# Patient Record
Sex: Female | Born: 1935 | Race: White | Hispanic: No | Marital: Married | Smoking: Former smoker
Health system: Southern US, Community
[De-identification: ages and names within clinical notes are randomized; demographics above are authoritative.]

## PROBLEM LIST (undated history)

## (undated) DIAGNOSIS — M2040 Other hammer toe(s) (acquired), unspecified foot: Secondary | ICD-10-CM

## (undated) DIAGNOSIS — E669 Obesity, unspecified: Secondary | ICD-10-CM

## (undated) DIAGNOSIS — J45909 Unspecified asthma, uncomplicated: Secondary | ICD-10-CM

## (undated) DIAGNOSIS — N3090 Cystitis, unspecified without hematuria: Secondary | ICD-10-CM

## (undated) DIAGNOSIS — J449 Chronic obstructive pulmonary disease, unspecified: Secondary | ICD-10-CM

## (undated) DIAGNOSIS — D1800 Hemangioma unspecified site: Secondary | ICD-10-CM

## (undated) DIAGNOSIS — C801 Malignant (primary) neoplasm, unspecified: Secondary | ICD-10-CM

## (undated) DIAGNOSIS — R768 Other specified abnormal immunological findings in serum: Secondary | ICD-10-CM

## (undated) DIAGNOSIS — E039 Hypothyroidism, unspecified: Secondary | ICD-10-CM

## (undated) DIAGNOSIS — M19011 Primary osteoarthritis, right shoulder: Secondary | ICD-10-CM

## (undated) DIAGNOSIS — H5203 Hypermetropia, bilateral: Secondary | ICD-10-CM

## (undated) DIAGNOSIS — M797 Fibromyalgia: Secondary | ICD-10-CM

## (undated) DIAGNOSIS — L820 Inflamed seborrheic keratosis: Secondary | ICD-10-CM

## (undated) DIAGNOSIS — K589 Irritable bowel syndrome without diarrhea: Secondary | ICD-10-CM

## (undated) DIAGNOSIS — I350 Nonrheumatic aortic (valve) stenosis: Secondary | ICD-10-CM

## (undated) DIAGNOSIS — M810 Age-related osteoporosis without current pathological fracture: Secondary | ICD-10-CM

## (undated) DIAGNOSIS — Z87891 Personal history of nicotine dependence: Secondary | ICD-10-CM

## (undated) DIAGNOSIS — E119 Type 2 diabetes mellitus without complications: Secondary | ICD-10-CM

## (undated) DIAGNOSIS — H5050 Unspecified heterophoria: Secondary | ICD-10-CM

## (undated) DIAGNOSIS — M542 Cervicalgia: Secondary | ICD-10-CM

## (undated) DIAGNOSIS — M5414 Radiculopathy, thoracic region: Secondary | ICD-10-CM

## (undated) DIAGNOSIS — R296 Repeated falls: Secondary | ICD-10-CM

## (undated) DIAGNOSIS — Z22322 Carrier or suspected carrier of Methicillin resistant Staphylococcus aureus: Secondary | ICD-10-CM

## (undated) DIAGNOSIS — M160 Bilateral primary osteoarthritis of hip: Secondary | ICD-10-CM

## (undated) DIAGNOSIS — R32 Unspecified urinary incontinence: Secondary | ICD-10-CM

## (undated) DIAGNOSIS — K449 Diaphragmatic hernia without obstruction or gangrene: Secondary | ICD-10-CM

## (undated) DIAGNOSIS — K219 Gastro-esophageal reflux disease without esophagitis: Secondary | ICD-10-CM

## (undated) DIAGNOSIS — I10 Essential (primary) hypertension: Secondary | ICD-10-CM

## (undated) DIAGNOSIS — H8110 Benign paroxysmal vertigo, unspecified ear: Secondary | ICD-10-CM

## (undated) DIAGNOSIS — M48062 Spinal stenosis, lumbar region with neurogenic claudication: Secondary | ICD-10-CM

## (undated) DIAGNOSIS — K5790 Diverticulosis of intestine, part unspecified, without perforation or abscess without bleeding: Secondary | ICD-10-CM

## (undated) DIAGNOSIS — I340 Nonrheumatic mitral (valve) insufficiency: Secondary | ICD-10-CM

## (undated) DIAGNOSIS — F32A Depression, unspecified: Secondary | ICD-10-CM

## (undated) DIAGNOSIS — M5417 Radiculopathy, lumbosacral region: Secondary | ICD-10-CM

## (undated) DIAGNOSIS — H9190 Unspecified hearing loss, unspecified ear: Secondary | ICD-10-CM

## (undated) DIAGNOSIS — E559 Vitamin D deficiency, unspecified: Secondary | ICD-10-CM

## (undated) DIAGNOSIS — D0362 Melanoma in situ of left upper limb, including shoulder: Secondary | ICD-10-CM

## (undated) DIAGNOSIS — H524 Presbyopia: Secondary | ICD-10-CM

## (undated) DIAGNOSIS — H52203 Unspecified astigmatism, bilateral: Secondary | ICD-10-CM

## (undated) DIAGNOSIS — G629 Polyneuropathy, unspecified: Secondary | ICD-10-CM

## (undated) DIAGNOSIS — H2511 Age-related nuclear cataract, right eye: Secondary | ICD-10-CM

## (undated) DIAGNOSIS — Z9841 Cataract extraction status, right eye: Secondary | ICD-10-CM

## (undated) DIAGNOSIS — H43813 Vitreous degeneration, bilateral: Secondary | ICD-10-CM

## (undated) DIAGNOSIS — Q6689 Other  specified congenital deformities of feet: Secondary | ICD-10-CM

## (undated) HISTORY — DX: Nonrheumatic mitral (valve) insufficiency: I34.0

## (undated) HISTORY — DX: Age-related osteoporosis without current pathological fracture: M81.0

## (undated) HISTORY — DX: Unspecified urinary incontinence: R32

## (undated) HISTORY — DX: Hypermetropia, bilateral: H52.03

## (undated) HISTORY — DX: Hypermetropia, bilateral: H52.4

## (undated) HISTORY — DX: Other hammer toe(s) (acquired), unspecified foot: M20.40

## (undated) HISTORY — DX: Other specified abnormal immunological findings in serum: R76.8

## (undated) HISTORY — DX: Depression, unspecified: F32.A

## (undated) HISTORY — DX: Melanoma in situ of left upper limb, including shoulder: D03.62

## (undated) HISTORY — DX: Other specified congenital deformities of feet: Q66.89

## (undated) HISTORY — DX: Irritable bowel syndrome without diarrhea: K58.9

## (undated) HISTORY — DX: Cystitis, unspecified without hematuria: N30.90

## (undated) HISTORY — DX: Diverticulosis of intestine, part unspecified, without perforation or abscess without bleeding: K57.90

## (undated) HISTORY — DX: Age-related nuclear cataract, right eye: H25.11

## (undated) HISTORY — DX: Gastro-esophageal reflux disease without esophagitis: K21.9

## (undated) HISTORY — DX: Malignant (primary) neoplasm, unspecified: C80.1

## (undated) HISTORY — DX: Type 2 diabetes mellitus without complications: E11.9

## (undated) HISTORY — DX: Fibromyalgia: M79.7

## (undated) HISTORY — DX: Radiculopathy, thoracic region: M54.14

## (undated) HISTORY — DX: Carrier or suspected carrier of methicillin resistant Staphylococcus aureus: Z22.322

## (undated) HISTORY — DX: Hemangioma unspecified site: D18.00

## (undated) HISTORY — DX: Primary osteoarthritis, right shoulder: M19.011

## (undated) HISTORY — DX: Repeated falls: R29.6

## (undated) HISTORY — DX: Unspecified astigmatism, bilateral: H52.203

## (undated) HISTORY — DX: Cervicalgia: M54.2

## (undated) HISTORY — DX: Hypothyroidism, unspecified: E03.9

## (undated) HISTORY — DX: Unspecified hearing loss, unspecified ear: H91.90

## (undated) HISTORY — DX: Chronic obstructive pulmonary disease, unspecified: J44.9

## (undated) HISTORY — DX: Unspecified heterophoria: H50.50

## (undated) HISTORY — DX: Nonrheumatic aortic (valve) stenosis: I35.0

## (undated) HISTORY — DX: Inflamed seborrheic keratosis: L82.0

## (undated) HISTORY — DX: Spinal stenosis, lumbar region with neurogenic claudication: M48.062

## (undated) HISTORY — DX: Benign paroxysmal vertigo, unspecified ear: H81.10

## (undated) HISTORY — DX: Obesity, unspecified: E66.9

## (undated) HISTORY — DX: Bilateral primary osteoarthritis of hip: M16.0

## (undated) HISTORY — DX: Polyneuropathy, unspecified: G62.9

## (undated) HISTORY — DX: Unspecified asthma, uncomplicated: J45.909

## (undated) HISTORY — DX: Personal history of nicotine dependence: Z87.891

## (undated) HISTORY — DX: Diaphragmatic hernia without obstruction or gangrene: K44.9

## (undated) HISTORY — DX: Essential (primary) hypertension: I10

## (undated) HISTORY — DX: Cataract extraction status, right eye: Z98.41

## (undated) HISTORY — DX: Vitreous degeneration, bilateral: H43.813

## (undated) HISTORY — DX: Radiculopathy, lumbosacral region: M54.17

## (undated) HISTORY — DX: Vitamin D deficiency, unspecified: E55.9

---

## 1970-08-01 HISTORY — PX: APPENDECTOMY: SHX54

## 1971-08-02 HISTORY — PX: BLADDER SUSPENSION: SHX72

## 1981-08-01 HISTORY — PX: OTHER SURGICAL HISTORY: SHX169

## 1987-08-02 HISTORY — PX: ESOPHAGUS SURGERY: SHX626

## 1987-08-02 HISTORY — PX: OTHER SURGICAL HISTORY: SHX169

## 1995-08-02 HISTORY — PX: BREAST LUMPECTOMY: SHX2

## 1999-08-02 HISTORY — PX: TOTAL KNEE ARTHROPLASTY: SHX125

## 2001-08-01 HISTORY — PX: TOTAL KNEE ARTHROPLASTY: SHX125

## 2003-07-02 DIAGNOSIS — K219 Gastro-esophageal reflux disease without esophagitis: Secondary | ICD-10-CM | POA: Insufficient documentation

## 2003-07-02 DIAGNOSIS — K589 Irritable bowel syndrome without diarrhea: Secondary | ICD-10-CM | POA: Insufficient documentation

## 2008-11-11 DIAGNOSIS — H8111 Benign paroxysmal vertigo, right ear: Secondary | ICD-10-CM | POA: Insufficient documentation

## 2010-08-01 HISTORY — PX: CATARACT EXTRACTION: SUR2

## 2016-06-17 DIAGNOSIS — M797 Fibromyalgia: Secondary | ICD-10-CM | POA: Insufficient documentation

## 2016-12-19 DIAGNOSIS — E669 Obesity, unspecified: Secondary | ICD-10-CM | POA: Insufficient documentation

## 2017-02-24 DIAGNOSIS — M48062 Spinal stenosis, lumbar region with neurogenic claudication: Secondary | ICD-10-CM | POA: Insufficient documentation

## 2017-08-01 HISTORY — PX: TOTAL HIP ARTHROPLASTY: SHX124

## 2020-02-10 DIAGNOSIS — K922 Gastrointestinal hemorrhage, unspecified: Secondary | ICD-10-CM | POA: Insufficient documentation

## 2020-04-13 ENCOUNTER — Ambulatory Visit (INDEPENDENT_AMBULATORY_CARE_PROVIDER_SITE_OTHER): Payer: Medicare Other | Admitting: Cardiovascular Disease

## 2020-04-13 ENCOUNTER — Encounter: Payer: Self-pay | Admitting: Cardiovascular Disease

## 2020-04-13 ENCOUNTER — Other Ambulatory Visit: Payer: Self-pay

## 2020-04-13 VITALS — BP 116/66 | HR 93 | Ht 62.0 in | Wt 202.0 lb

## 2020-04-13 DIAGNOSIS — I340 Nonrheumatic mitral (valve) insufficiency: Secondary | ICD-10-CM | POA: Insufficient documentation

## 2020-04-13 DIAGNOSIS — I10 Essential (primary) hypertension: Secondary | ICD-10-CM | POA: Diagnosis not present

## 2020-04-13 NOTE — Patient Instructions (Signed)
Medication Instructions:  Your physician has recommended you make the following change in your medication:   STOP: lisinopril  *If you need a refill on your cardiac medications before your next appointment, please call your pharmacy*   Lab Work: None  If you have labs (blood work) drawn today and your tests are completely normal, you will receive your results only by: Marland Kitchen MyChart Message (if you have MyChart) OR . A paper copy in the mail If you have any lab test that is abnormal or we need to change your treatment, we will call you to review the results.   Testing/Procedures: Your physician has requested that you have an echocardiogram. Echocardiography is a painless test that uses sound waves to create images of your heart. It provides your doctor with information about the size and shape of your heart and how well your heart's chambers and valves are working. This procedure takes approximately one hour. There are no restrictions for this procedure.  Follow-Up: At [facility], you and your health needs are our priority.  As part of our continuing mission to provide you with exceptional heart care, we have created designated Provider Care Teams.  These Care Teams include your primary Cardiologist (physician) and Advanced Practice Providers (APPs -  Physician Assistants and Nurse Practitioners) who all work together to provide you with the care you need, when you need it.  We recommend signing up for the patient portal called "MyChart".  Sign up information is provided on this After Visit Summary.  MyChart is used to connect with patients for Virtual Visits (Telemedicine).  Patients are able to view lab/test results, encounter notes, upcoming appointments, etc.  Non-urgent messages can be sent to your provider as well.   To learn more about what you can do with MyChart, go to [email or web address].    Your next appointment:   12 month(s)  The format for your next appointment:   In  Person  Provider:   You may see Mertie Moores, MD or one of the following Advanced Practice Providers on your designated Care Team:    Richardson Dopp, PA-C  Robbie Lis, [place]    Other Instructions None

## 2020-04-13 NOTE — Progress Notes (Signed)
Cardiology Office Note:    Date:  04/13/2020   ID:  Darlene Lloyd, DOB 10/17/1932, MRN [id number]  PCP:  Lajean Manes, MD  [facility] Cardiologist:  [facility] Electrophysiologist:  None   Referring MD: Lajean Manes, MD   Chief Complaint  Patient presents with  . Hypertension    Sept, 13, 2021   Darlene Lloyd is a 86 y.o. female with a hx of aortic stenosis We are asked to see her for further eval of her AS by Dr. Felipa Eth  Recently moved from [place].  Had some spine surgery . A pre op eval revealed a murmur ,  Echo apparently showed mitral valve regurgitation   No CP , Does have shortness of breath  Dyspnea is slow in developing  No palpitations  Uses a walker since having her hip replaced Has benign positional vertigo and uses a walker  Walking with the walker makes her short of breath      Past Medical History:  Diagnosis Date  . ANA positive   . Aortic valve stenosis   . Asthma   . BPPV (benign paroxysmal positional vertigo)   . Cancer [facility])    breast  . Cataract extraction status of eye, right   . Cervicalgia   . Claw toe   . COPD (chronic obstructive pulmonary disease) ([place])   . Cystitis   . Depression   . Diverticulosis   . DM (diabetes mellitus) ([place])   . Fibromyalgia   . Former smoker   . Frequent falls   . GERD (gastroesophageal reflux disease)   . Hammer toe   . Hearing loss   . Hemangioma   . Hernia, diaphragmatic   . Hyperopia of both eyes with astigmatism and presbyopia   . Hypertension   . Hypothyroidism   . IBS (irritable bowel syndrome)   . Keratosis, inflamed seborrheic   . Melanoma in situ of left upper arm ([place])   . Mitral valve regurgitation   . MRSA carrier   . Neuropathy   . Nuclear sclerosis of right eye   . Obesity   . Osteoarthritis of both hips   . Osteoarthritis of right shoulder   . Osteoporosis   . Phoria   . PVD (posterior vitreous detachment), both eyes   . Senile osteoporosis   . Spinal  stenosis of lumbar region with neurogenic claudication   . Thoracic and lumbosacral neuritis   . Urine incontinence   . Vitamin D deficiency     Past Surgical History:  Procedure Laterality Date  . APPENDECTOMY  1972  . BLADDER SUSPENSION  1973  . BREAST LUMPECTOMY  1997  . CATARACT EXTRACTION  2012  . ESOPHAGUS SURGERY  1989  . hiatal  1989  . laminectomy  1983  . TOTAL HIP ARTHROPLASTY  2019  . TOTAL KNEE ARTHROPLASTY  2001  . TOTAL KNEE ARTHROPLASTY  2003    Current Medications: Current Meds  Medication Sig  . albuterol (VENTOLIN HFA) 108 (90 Base) MCG/ACT inhaler Inhale into the lungs every 6 (six) hours as needed for wheezing or shortness of breath.  . ALPRAZolam (XANAX) 0.25 MG tablet Take 0.25 mg by mouth at bedtime as needed for anxiety.  . AMLODIPINE BESYLATE PO Take 10 mg by mouth.  Marland Kitchen ascorbic acid (VITAMIN C) 500 MG tablet Take 500 mg by mouth daily.  . bisacodyl (BISACODYL) 5 MG EC tablet Take 10 mg by mouth daily as needed for moderate constipation.  . Cholecalciferol (VITAMIN D3)  25 MCG (1000 UT) CAPS Take by mouth.  . dicyclomine (BENTYL) 20 MG tablet Take 20 mg by mouth every 6 (six) hours.  Marland Kitchen esomeprazole (NEXIUM) 20 MG capsule Take 20 mg by mouth daily at 12 noon.  . fluticasone (FLONASE) 50 MCG/ACT nasal spray Place into both nostrils daily.  . Fluticasone-Salmeterol (ADVAIR) 100-50 MCG/DOSE AEPB Inhale 1 puff into the lungs 2 (two) times daily.  . folic acid (FOLVITE) 761 MCG tablet Take 400 mcg by mouth daily.  Marland Kitchen HYOSCYAMINE SULFATE PO Take 0.125 mg by mouth.  . lamoTRIgine (LAMICTAL) 25 MG tablet Take 25 mg by mouth daily.  Marland Kitchen levothyroxine (SYNTHROID) 150 MCG tablet Take 150 mcg by mouth daily before breakfast.  . Loperamide HCl (IMODIUM A-D PO) Take by mouth.  . losartan (COZAAR) 25 MG tablet Take 25 mg by mouth daily.  . Magnesium 500 MG CAPS Take by mouth.  . MULTIPLE VITAMIN PO Take by mouth.  . naproxen (NAPROSYN) 500 MG tablet Take 500 mg by mouth  2 (two) times daily with a meal.  . Niacin (VITAMIN B-3 PO) Take 25 mcg by mouth.  . potassium chloride (KLOR-CON) 10 MEQ tablet Take 10 mEq by mouth once.  . pyridOXINE (VITAMIN B-6) 100 MG tablet Take 100 mg by mouth daily.  . sertraline (ZOLOFT) 100 MG tablet Take 100 mg by mouth daily.  . [DISCONTINUED] lisinopril (ZESTRIL) 5 MG tablet Take 5 mg by mouth daily.     Allergies:   Codeine and Sulfasalazine   Social History   Socioeconomic History  . Marital status: Married    Spouse name: Not on file  . Number of children: Not on file  . Years of education: Not on file  . Highest education level: Not on file  Occupational History  . Not on file  Tobacco Use  . Smoking status: Former Research scientist (life sciences)  . Smokeless tobacco: Never Used  Substance and Sexual Activity  . Alcohol use: Yes  . Drug use: Never  . Sexual activity: Not on file  Other Topics Concern  . Not on file  Social History Narrative  . Not on file   Social Determinants of Health   Financial Resource Strain:   . Difficulty of Paying Living Expenses: Not on file  Food Insecurity:   . Worried About Charity fundraiser in the Last Year: Not on file  . Ran Out of Food in the Last Year: Not on file  Transportation Needs:   . Lack of Transportation (Medical): Not on file  . Lack of Transportation (Non-Medical): Not on file  Physical Activity:   . Days of Exercise per Week: Not on file  . Minutes of Exercise per Session: Not on file  Stress:   . Feeling of Stress : Not on file  Social Connections:   . Frequency of Communication with Friends and Family: Not on file  . Frequency of Social Gatherings with Friends and Family: Not on file  . Attends Religious Services: Not on file  . Active Member of Clubs or Organizations: Not on file  . Attends Archivist Meetings: Not on file  . Marital Status: Not on file     Family History: The patient's family history includes Hypertension in her father.  ROS:   Please  see the history of present illness.     All other systems reviewed and are negative.  EKGs/Labs/Other Studies Reviewed:    The following studies were reviewed today:   EKG:  EKG is  ordered today.  The ekg ordered today demonstrates   Recent Labs: No results found for requested labs within last 8760 hours.  Recent Lipid Panel No results found for: CHOL, TRIG, HDL, CHOLHDL, VLDL, LDLCALC, LDLDIRECT  Physical Exam:    VS:  BP 116/66   Pulse 93   Ht 5\' 2"  (1.575 m)   Wt 202 lb (91.6 kg)   SpO2 96%   BMI 36.95 kg/m     Wt Readings from Last 3 Encounters:  04/13/20 202 lb (91.6 kg)     GEN:   Elderly female,  NAD  HEENT: Normal NECK: No JVD; No carotid bruits LYMPHATICS: No lymphadenopathy CARDIAC:  RR, 2/6 systolic murmur at LSB,  No radiation to axilla.  No significant ratiation to RSB  RESPIRATORY:  Clear to auscultation without rales, wheezing or rhonchi  ABDOMEN: moderately obese  MUSCULOSKELETAL:  No edema; No deformity  SKIN: Warm and dry NEUROLOGIC:  Alert and oriented x 3 PSYCHIATRIC:  Normal affect   ECG: April 13, 2020: Normal sinus rhythm at 93.?  Previous inferior wall myocardial infarction.  ASSESSMENT:    1. Mitral valve insufficiency, unspecified etiology    PLAN:    In order of problems listed above:  1. Mitral regurgitation: Patient has a systolic murmur and was told at her previous doctors office that she had mitral regurgitation.  Like to get a repeat echocardiogram to further evaluate her LV function and mitral valve function.  Its been over a year since her last echo.  At present she does not seem to be limited by any cardiac issues.    Does not avoid salt .  We discussed alternatives.   2.  HTN:  She has Lisinopril and Losartan both on her med list.   Her husband said they stopped the one that made her cough.   We will take the Lisinopril off the list.    Will see her in 1 year    Medication Adjustments/Labs and Tests  Ordered: Current medicines are reviewed at length with the patient today.  Concerns regarding medicines are outlined above.  Orders Placed This Encounter  Procedures  . EKG 12-Lead  . ECHOCARDIOGRAM COMPLETE   No orders of the defined types were placed in this encounter.    Patient Instructions  Medication Instructions:  Your physician has recommended you make the following change in your medication:   STOP: lisinopril  *If you need a refill on your cardiac medications before your next appointment, please call your pharmacy*   Lab Work: None  If you have labs (blood work) drawn today and your tests are completely normal, you will receive your results only by: Marland Kitchen MyChart Message (if you have MyChart) OR . A paper copy in the mail If you have any lab test that is abnormal or we need to change your treatment, we will call you to review the results.   Testing/Procedures: Your physician has requested that you have an echocardiogram. Echocardiography is a painless test that uses sound waves to create images of your heart. It provides your doctor with information about the size and shape of your heart and how well your heart's chambers and valves are working. This procedure takes approximately one hour. There are no restrictions for this procedure.  Follow-Up: At [facility], you and your health needs are our priority.  As part of our continuing mission to provide you with exceptional heart care, we have created designated Provider Care Teams.  These Care Teams include  your primary Cardiologist (physician) and Advanced Practice Providers (APPs -  Physician Assistants and Nurse Practitioners) who all work together to provide you with the care you need, when you need it.  We recommend signing up for the patient portal called "MyChart".  Sign up information is provided on this After Visit Summary.  MyChart is used to connect with patients for Virtual Visits (Telemedicine).  Patients are  able to view lab/test results, encounter notes, upcoming appointments, etc.  Non-urgent messages can be sent to your provider as well.   To learn more about what you can do with MyChart, go to [email or web address].    Your next appointment:   12 month(s)  The format for your next appointment:   In Person  Provider:   You may see Mertie Moores, MD or one of the following Advanced Practice Providers on your designated Care Team:    Richardson Dopp, PA-C  Robbie Lis, [place]    Other Instructions None     Signed, Mertie Moores, MD  04/13/2020 6:00 PM    [place]

## 2020-04-28 ENCOUNTER — Ambulatory Visit
Admission: RE | Admit: 2020-04-28 | Discharge: 2020-04-28 | Disposition: A | Discharge status: Home / Self Care | Payer: Medicare Other | Source: Ambulatory Visit | Attending: Geriatric Medicine | Admitting: Geriatric Medicine

## 2020-04-28 ENCOUNTER — Other Ambulatory Visit (HOSPITAL_COMMUNITY): Payer: PRIVATE HEALTH INSURANCE

## 2020-04-28 ENCOUNTER — Other Ambulatory Visit: Payer: Self-pay | Admitting: Geriatric Medicine

## 2020-04-28 DIAGNOSIS — R0602 Shortness of breath: Secondary | ICD-10-CM

## 2020-04-28 DIAGNOSIS — R7989 Other specified abnormal findings of blood chemistry: Secondary | ICD-10-CM

## 2020-04-30 ENCOUNTER — Ambulatory Visit (HOSPITAL_COMMUNITY): Payer: Medicare Other

## 2020-05-01 ENCOUNTER — Ambulatory Visit (HOSPITAL_COMMUNITY)
Admission: RE | Admit: 2020-05-01 | Discharge: 2020-05-01 | Disposition: A | Discharge status: Home / Self Care | Payer: Medicare Other | Source: Ambulatory Visit | Attending: Geriatric Medicine | Admitting: Geriatric Medicine

## 2020-05-01 ENCOUNTER — Other Ambulatory Visit: Payer: Self-pay

## 2020-05-01 DIAGNOSIS — R7989 Other specified abnormal findings of blood chemistry: Secondary | ICD-10-CM | POA: Insufficient documentation

## 2020-05-01 LAB — POCT I-STAT CREATININE: Creatinine, Ser: 0.8 mg/dL (ref 0.44–1.00)

## 2020-05-01 MED ORDER — IOHEXOL 350 MG/ML SOLN
100.0000 mL | Freq: Once | INTRAVENOUS | Status: AC | PRN
  Administered 2020-05-01: 100 mL via INTRAVENOUS

## 2020-05-04 ENCOUNTER — Other Ambulatory Visit: Payer: Self-pay

## 2020-05-04 ENCOUNTER — Ambulatory Visit (HOSPITAL_COMMUNITY): Payer: Medicare Other | Attending: Cardiovascular Disease

## 2020-05-04 DIAGNOSIS — I340 Nonrheumatic mitral (valve) insufficiency: Secondary | ICD-10-CM | POA: Diagnosis not present

## 2020-05-04 LAB — ECHOCARDIOGRAM COMPLETE
AR max vel: 0.96 cm2
AV Area VTI: 0.99 cm2
AV Area mean vel: 0.9 cm2
AV Mean grad: 17.5 mmHg
AV Peak grad: 32.3 mmHg
Ao pk vel: 2.84 m/s
Area-P 1/2: 2.2 cm2
S' Lateral: 2.4 cm

## 2020-06-08 ENCOUNTER — Other Ambulatory Visit: Payer: Self-pay | Admitting: Gastroenterology

## 2020-06-08 DIAGNOSIS — D509 Iron deficiency anemia, unspecified: Secondary | ICD-10-CM

## 2020-06-08 DIAGNOSIS — K449 Diaphragmatic hernia without obstruction or gangrene: Secondary | ICD-10-CM

## 2020-06-11 ENCOUNTER — Other Ambulatory Visit: Payer: Medicare Other

## 2020-06-12 ENCOUNTER — Ambulatory Visit
Admission: RE | Admit: 2020-06-12 | Discharge: 2020-06-12 | Disposition: A | Discharge status: Home / Self Care | Payer: Medicare Other | Source: Ambulatory Visit | Attending: Gastroenterology | Admitting: Gastroenterology

## 2020-06-12 DIAGNOSIS — K449 Diaphragmatic hernia without obstruction or gangrene: Secondary | ICD-10-CM

## 2020-06-12 DIAGNOSIS — D509 Iron deficiency anemia, unspecified: Secondary | ICD-10-CM

## 2020-07-09 ENCOUNTER — Inpatient Hospital Stay: Admission: RE | Admit: 2020-07-09 | Payer: Medicare Other | Source: Ambulatory Visit

## 2020-08-12 ENCOUNTER — Other Ambulatory Visit: Payer: Self-pay | Admitting: Gastroenterology

## 2020-08-12 DIAGNOSIS — R1030 Lower abdominal pain, unspecified: Secondary | ICD-10-CM

## 2020-08-12 DIAGNOSIS — Z862 Personal history of diseases of the blood and blood-forming organs and certain disorders involving the immune mechanism: Secondary | ICD-10-CM

## 2020-08-26 ENCOUNTER — Ambulatory Visit
Admission: RE | Admit: 2020-08-26 | Discharge: 2020-08-26 | Disposition: A | Discharge status: Home / Self Care | Payer: Medicare Other | Source: Ambulatory Visit | Attending: Gastroenterology | Admitting: Gastroenterology

## 2020-08-26 ENCOUNTER — Other Ambulatory Visit: Payer: Self-pay

## 2020-08-26 DIAGNOSIS — R1030 Lower abdominal pain, unspecified: Secondary | ICD-10-CM

## 2020-08-26 DIAGNOSIS — Z862 Personal history of diseases of the blood and blood-forming organs and certain disorders involving the immune mechanism: Secondary | ICD-10-CM

## 2020-08-26 MED ORDER — IOPAMIDOL (ISOVUE-300) INJECTION 61%
100.0000 mL | Freq: Once | INTRAVENOUS | Status: AC | PRN
  Administered 2020-08-26: 100 mL via INTRAVENOUS

## 2020-10-07 ENCOUNTER — Other Ambulatory Visit: Payer: Self-pay | Admitting: Geriatric Medicine

## 2020-10-07 DIAGNOSIS — J3489 Other specified disorders of nose and nasal sinuses: Secondary | ICD-10-CM

## 2020-10-22 ENCOUNTER — Ambulatory Visit
Admission: RE | Admit: 2020-10-22 | Discharge: 2020-10-22 | Disposition: A | Discharge status: Home / Self Care | Payer: Medicare Other | Source: Ambulatory Visit | Attending: Geriatric Medicine | Admitting: Geriatric Medicine

## 2020-10-22 DIAGNOSIS — J3489 Other specified disorders of nose and nasal sinuses: Secondary | ICD-10-CM

## 2021-02-09 ENCOUNTER — Ambulatory Visit (INDEPENDENT_AMBULATORY_CARE_PROVIDER_SITE_OTHER): Payer: Medicare Other | Admitting: Otolaryngology

## 2021-02-09 ENCOUNTER — Other Ambulatory Visit: Payer: Self-pay

## 2021-02-09 DIAGNOSIS — H903 Sensorineural hearing loss, bilateral: Secondary | ICD-10-CM | POA: Diagnosis not present

## 2021-02-09 DIAGNOSIS — H6123 Impacted cerumen, bilateral: Secondary | ICD-10-CM | POA: Diagnosis not present

## 2021-02-09 DIAGNOSIS — J310 Chronic rhinitis: Secondary | ICD-10-CM

## 2021-02-09 NOTE — Progress Notes (Signed)
HPI: Darlene Lloyd is a 87 y.o. female who presents is referred by her PCP for evaluation of wax buildup in her ears.  She wears bilateral hearing aids.  She saw [facility] recently who told her that the right ear canal was completely occluded with cerumen.  She also inquires about sinus issues.  Mostly congestion of her sinuses..  She had a CT scan of her sinuses recently that showed clear paranasal sinuses.  Past Medical History:  Diagnosis Date   ANA positive    Aortic valve stenosis    Asthma    BPPV (benign paroxysmal positional vertigo)    Cancer ([facility])    breast   Cataract extraction status of eye, right    Cervicalgia    Claw toe    COPD (chronic obstructive pulmonary disease) ([place])    Cystitis    Depression    Diverticulosis    DM (diabetes mellitus) ([employer])    Fibromyalgia    Former smoker    Frequent falls    GERD (gastroesophageal reflux disease)    Hammer toe    Hearing loss    Hemangioma    Hernia, diaphragmatic    Hyperopia of both eyes with astigmatism and presbyopia    Hypertension    Hypothyroidism    IBS (irritable bowel syndrome)    Keratosis, inflamed seborrheic    Melanoma in situ of left upper arm ([facility])    Mitral valve regurgitation    MRSA carrier    Neuropathy    Nuclear sclerosis of right eye    Obesity    Osteoarthritis of both hips    Osteoarthritis of right shoulder    Osteoporosis    Phoria    PVD (posterior vitreous detachment), both eyes    Senile osteoporosis    Spinal stenosis of lumbar region with neurogenic claudication    Thoracic and lumbosacral neuritis    Urine incontinence    Vitamin D deficiency    Past Surgical History:  Procedure Laterality Date   [place]   BREAST LUMPECTOMY  1997   CATARACT EXTRACTION  2012   ESOPHAGUS SURGERY  1989   hiatal  1989   laminectomy  1983   TOTAL HIP ARTHROPLASTY  2019   TOTAL KNEE ARTHROPLASTY  2001   TOTAL KNEE ARTHROPLASTY  2003   Social  History   Socioeconomic History   Marital status: Married    Spouse name: Not on file   Number of children: Not on file   Years of education: Not on file   Highest education level: Not on file  Occupational History   Not on file  Tobacco Use   Smoking status: Former    Pack years: 0.00   Smokeless tobacco: Never  Substance and Sexual Activity   Alcohol use: Yes   Drug use: Never   Sexual activity: Not on file  Other Topics Concern   Not on file  Social History Narrative   Not on file   Social Determinants of Health   Financial Resource Strain: Not on file  Food Insecurity: Not on file  Transportation Needs: Not on file  Physical Activity: Not on file  Stress: Not on file  Social Connections: Not on file   Family History  Problem Relation Age of Onset   Hypertension Father    Allergies  Allergen Reactions   Codeine    Sulfasalazine    Prior to Admission medications   Medication Sig Start  Date End Date Taking? Authorizing Provider  albuterol (VENTOLIN HFA) 108 (90 Base) MCG/ACT inhaler Inhale into the lungs every 6 (six) hours as needed for wheezing or shortness of breath.    [provider]  ALPRAZolam Duanne Moron) 0.25 MG tablet Take 0.25 mg by mouth at bedtime as needed for anxiety.    [provider]  AMLODIPINE BESYLATE PO Take 10 mg by mouth.    [provider]  ascorbic acid (VITAMIN C) 500 MG tablet Take 500 mg by mouth daily.    [provider]  bisacodyl (BISACODYL) 5 MG EC tablet Take 10 mg by mouth daily as needed for moderate constipation.    [provider]  Cholecalciferol (VITAMIN D3) 25 MCG (1000 UT) CAPS Take by mouth.    [provider]  dicyclomine (BENTYL) 20 MG tablet Take 20 mg by mouth every 6 (six) hours.    [provider]  esomeprazole (NEXIUM) 20 MG capsule Take 20 mg by mouth daily at 12 noon.    [provider]  fluticasone (FLONASE) 50 MCG/ACT nasal spray Place into both  nostrils daily.    [provider]  Fluticasone-Salmeterol (ADVAIR) 100-50 MCG/DOSE AEPB Inhale 1 puff into the lungs 2 (two) times daily.    [provider]  folic acid (FOLVITE) 119 MCG tablet Take 400 mcg by mouth daily.    [provider]  HYOSCYAMINE SULFATE PO Take 0.125 mg by mouth.    [provider]  lamoTRIgine (LAMICTAL) 25 MG tablet Take 25 mg by mouth daily.    [provider]  levothyroxine (SYNTHROID) 150 MCG tablet Take 150 mcg by mouth daily before breakfast.    [provider]  Loperamide HCl (IMODIUM A-D PO) Take by mouth.    [provider]  losartan (COZAAR) 25 MG tablet Take 25 mg by mouth daily.    [provider]  Magnesium 500 MG CAPS Take by mouth.    [provider]  MULTIPLE VITAMIN PO Take by mouth.    [provider]  naproxen (NAPROSYN) 500 MG tablet Take 500 mg by mouth 2 (two) times daily with a meal.    [provider]  Niacin (VITAMIN B-3 PO) Take 25 mcg by mouth.    [provider]  potassium chloride (KLOR-CON) 10 MEQ tablet Take 10 mEq by mouth once.    [provider]  pyridOXINE (VITAMIN B-6) 100 MG tablet Take 100 mg by mouth daily.    [provider]  sertraline (ZOLOFT) 100 MG tablet Take 100 mg by mouth daily.    [provider]     Positive ROS: Otherwise negative  All other systems have been reviewed and were otherwise negative with the exception of those mentioned in the HPI and as above.  Physical Exam: Constitutional: Alert, well-appearing, no acute distress Ears: External ears without lesions or tenderness. Ear canals with a large amount of wax especially on the right side that was cleaned with curette and forceps.  The TMs were clear bilaterally and hearing was better after removing the wax. Nasal: External nose without lesions. Septum with mild deformity to the left.  Nasal passages otherwise clear with no  evidence of mucopurulent discharge.. Clear nasal passages Oral: Lips and gums without lesions. Tongue and palate mucosa without lesions. Posterior oropharynx clear. Neck: No palpable adenopathy or masses Respiratory: Breathing comfortably  Skin: No facial/neck lesions or rash noted.  Cerumen impaction removal  Date/Time: 02/09/2021 2:26 PM Performed by: Lucia Gaskins,  Leonides Sake, MD Authorized by: Rozetta Nunnery, MD   Consent:    Consent obtained:  Verbal   Consent given by:  Patient   Risks discussed:  Pain and bleeding Procedure details:    Location:  L ear and R ear   Procedure type: curette   Post-procedure details:    Inspection:  TM intact and canal normal   Hearing quality:  Improved   Procedure completion:  Tolerated well, no immediate complications Comments:     TMs were clear bilaterally.  She had much more wax on the right side compared to the left that was removed with curettes.  Assessment: Cerumen buildup blocking her hearing aid on the right side. Chronic rhinitis.  Plan: Ear canals were cleaned in the office with improved hearing. Concerning her nasal sinus issues would recommend regular use of Flonase or Nasacort 2 sprays each nostril at night and use of saline rinse during the daytime as needed nasal drainage. She will follow-up as needed.   Radene Journey, MD   CC:

## 2021-04-21 ENCOUNTER — Other Ambulatory Visit: Payer: Self-pay | Admitting: Physician Assistant

## 2021-04-21 ENCOUNTER — Ambulatory Visit
Admission: RE | Admit: 2021-04-21 | Discharge: 2021-04-21 | Disposition: A | Discharge status: Home / Self Care | Payer: Medicare Other | Source: Ambulatory Visit | Attending: Physician Assistant | Admitting: Physician Assistant

## 2021-04-21 DIAGNOSIS — R52 Pain, unspecified: Secondary | ICD-10-CM

## 2021-06-03 ENCOUNTER — Other Ambulatory Visit: Payer: Self-pay

## 2021-06-03 ENCOUNTER — Ambulatory Visit (INDEPENDENT_AMBULATORY_CARE_PROVIDER_SITE_OTHER): Payer: Medicare Other | Admitting: Sports Medicine

## 2021-06-03 ENCOUNTER — Encounter: Payer: Self-pay | Admitting: Sports Medicine

## 2021-06-03 DIAGNOSIS — L84 Corns and callosities: Secondary | ICD-10-CM

## 2021-06-03 DIAGNOSIS — B351 Tinea unguium: Secondary | ICD-10-CM

## 2021-06-03 DIAGNOSIS — I739 Peripheral vascular disease, unspecified: Secondary | ICD-10-CM | POA: Diagnosis not present

## 2021-06-03 DIAGNOSIS — M79672 Pain in left foot: Secondary | ICD-10-CM

## 2021-06-03 DIAGNOSIS — M79675 Pain in left toe(s): Secondary | ICD-10-CM | POA: Diagnosis not present

## 2021-06-03 DIAGNOSIS — M79674 Pain in right toe(s): Secondary | ICD-10-CM | POA: Diagnosis not present

## 2021-06-03 DIAGNOSIS — M79671 Pain in right foot: Secondary | ICD-10-CM

## 2021-06-03 NOTE — Progress Notes (Signed)
Subjective: Unnamed Hino is a 87 y.o. female patient seen today in office with complaint of mildly painful thickened and elongated toenails and callus; unable to trim. Patient denies history of Diabetes or neuropathy, but has issues with circulation and varicose veins. Patient reports that she is recovering from hip issues. Patient has no other pedal complaints at this time.   Patient Active Problem List   Diagnosis Date Noted   Mitral regurgitation 04/13/2020   HTN (hypertension) 04/13/2020    Current Outpatient Medications on File Prior to Visit  Medication Sig Dispense Refill   albuterol (VENTOLIN HFA) 108 (90 Base) MCG/ACT inhaler Inhale into the lungs every 6 (six) hours as needed for wheezing or shortness of breath.     ALPRAZolam (XANAX) 0.25 MG tablet Take 0.25 mg by mouth at bedtime as needed for anxiety.     AMLODIPINE BESYLATE PO Take 10 mg by mouth.     ascorbic acid (VITAMIN C) 500 MG tablet Take 500 mg by mouth daily.     bisacodyl (BISACODYL) 5 MG EC tablet Take 10 mg by mouth daily as needed for moderate constipation.     Cholecalciferol (VITAMIN D3) 25 MCG (1000 UT) CAPS Take by mouth.     dicyclomine (BENTYL) 20 MG tablet Take 20 mg by mouth every 6 (six) hours.     esomeprazole (NEXIUM) 20 MG capsule Take 20 mg by mouth daily at 12 noon.     fluticasone (FLONASE) 50 MCG/ACT nasal spray Place into both nostrils daily.     Fluticasone-Salmeterol (ADVAIR) 100-50 MCG/DOSE AEPB Inhale 1 puff into the lungs 2 (two) times daily.     folic acid (FOLVITE) 233 MCG tablet Take 400 mcg by mouth daily.     HYOSCYAMINE SULFATE PO Take 0.125 mg by mouth.     lamoTRIgine (LAMICTAL) 25 MG tablet Take 25 mg by mouth daily.     levothyroxine (SYNTHROID) 150 MCG tablet Take 150 mcg by mouth daily before breakfast.     Loperamide HCl (IMODIUM A-D PO) Take by mouth.     losartan (COZAAR) 25 MG tablet Take 25 mg by mouth daily.     Magnesium 500 MG CAPS Take by mouth.     MULTIPLE VITAMIN  PO Take by mouth.     naproxen (NAPROSYN) 500 MG tablet Take 500 mg by mouth 2 (two) times daily with a meal.     Niacin (VITAMIN B-3 PO) Take 25 mcg by mouth.     potassium chloride (KLOR-CON) 10 MEQ tablet Take 10 mEq by mouth once.     pyridOXINE (VITAMIN B-6) 100 MG tablet Take 100 mg by mouth daily.     sertraline (ZOLOFT) 100 MG tablet Take 100 mg by mouth daily.     No current facility-administered medications on file prior to visit.    Allergies  Allergen Reactions   Codeine    Sulfasalazine     Objective: Physical Exam  General: Well developed, nourished, no acute distress, awake, alert and oriented x 3  Vascular: Dorsalis pedis artery 1/4 bilateral, Posterior tibial artery 0/4 bilateral, skin temperature warm to warm proximal to distal bilateral lower extremities, mild varicosities, scant pedal hair present bilateral.  Neurological: Gross sensation present via light touch bilateral.   Dermatological: Skin is warm, dry, and supple bilateral, Nails 1-10 are tender, long, thick, and discolored with mild subungal debris, no webspace macerations present bilateral, no open lesions present bilateral, + callus/hyperkeratotic tissue present bilateral sub met 1. No signs of infection bilateral.  Musculoskeletal:  Asymptomatic fat pad atrophy and digital deformities noted bilateral. Muscular strength within normal limits without painon range of motion. No pain with calf compression bilateral.  Assessment and Plan:  Problem List Items Addressed This Visit   None Visit Diagnoses     Pain due to onychomycosis of toenails of both feet    -  Primary   Callus       Foot pain, bilateral       PVD (peripheral vascular disease) ([place])           -Examined patient.  -Discussed treatment options for painful mycotic nails. -Mechanically debrided callus x 2 using sterile 15 blade and reduced mycotic nails with sterile nail nipper and dremel nail file without incident. -Patient to return  in 3 months for follow up evaluation or sooner if symptoms worsen.  Landis Martins, DPM

## 2021-06-28 ENCOUNTER — Other Ambulatory Visit: Payer: Self-pay

## 2021-06-28 ENCOUNTER — Other Ambulatory Visit: Payer: Self-pay | Admitting: Geriatric Medicine

## 2021-06-28 ENCOUNTER — Ambulatory Visit
Admission: RE | Admit: 2021-06-28 | Discharge: 2021-06-28 | Disposition: A | Discharge status: Home / Self Care | Payer: Medicare Other | Source: Ambulatory Visit | Attending: Geriatric Medicine | Admitting: Geriatric Medicine

## 2021-06-28 DIAGNOSIS — R0789 Other chest pain: Secondary | ICD-10-CM

## 2021-07-05 ENCOUNTER — Other Ambulatory Visit: Payer: Self-pay | Admitting: Geriatric Medicine

## 2021-07-05 DIAGNOSIS — R0789 Other chest pain: Secondary | ICD-10-CM

## 2021-07-09 ENCOUNTER — Other Ambulatory Visit: Payer: Self-pay

## 2021-07-09 ENCOUNTER — Ambulatory Visit
Admission: RE | Admit: 2021-07-09 | Discharge: 2021-07-09 | Disposition: A | Discharge status: Home / Self Care | Payer: Medicare Other | Source: Ambulatory Visit | Attending: Geriatric Medicine | Admitting: Geriatric Medicine

## 2021-07-09 DIAGNOSIS — R0789 Other chest pain: Secondary | ICD-10-CM

## 2021-09-09 ENCOUNTER — Ambulatory Visit: Payer: Medicare Other | Admitting: Sports Medicine

## 2021-10-05 IMAGING — CR DG SHOULDER 2+V*R*
3 series · 3 of 3 positions shown · non-contrast
Comparison: None.

CLINICAL DATA: Right shoulder pain after fall.

EXAM:
RIGHT SHOULDER - 2+ VIEW; RIGHT HUMERUS - 2+ VIEW

[w shoulder ap internal righ *]
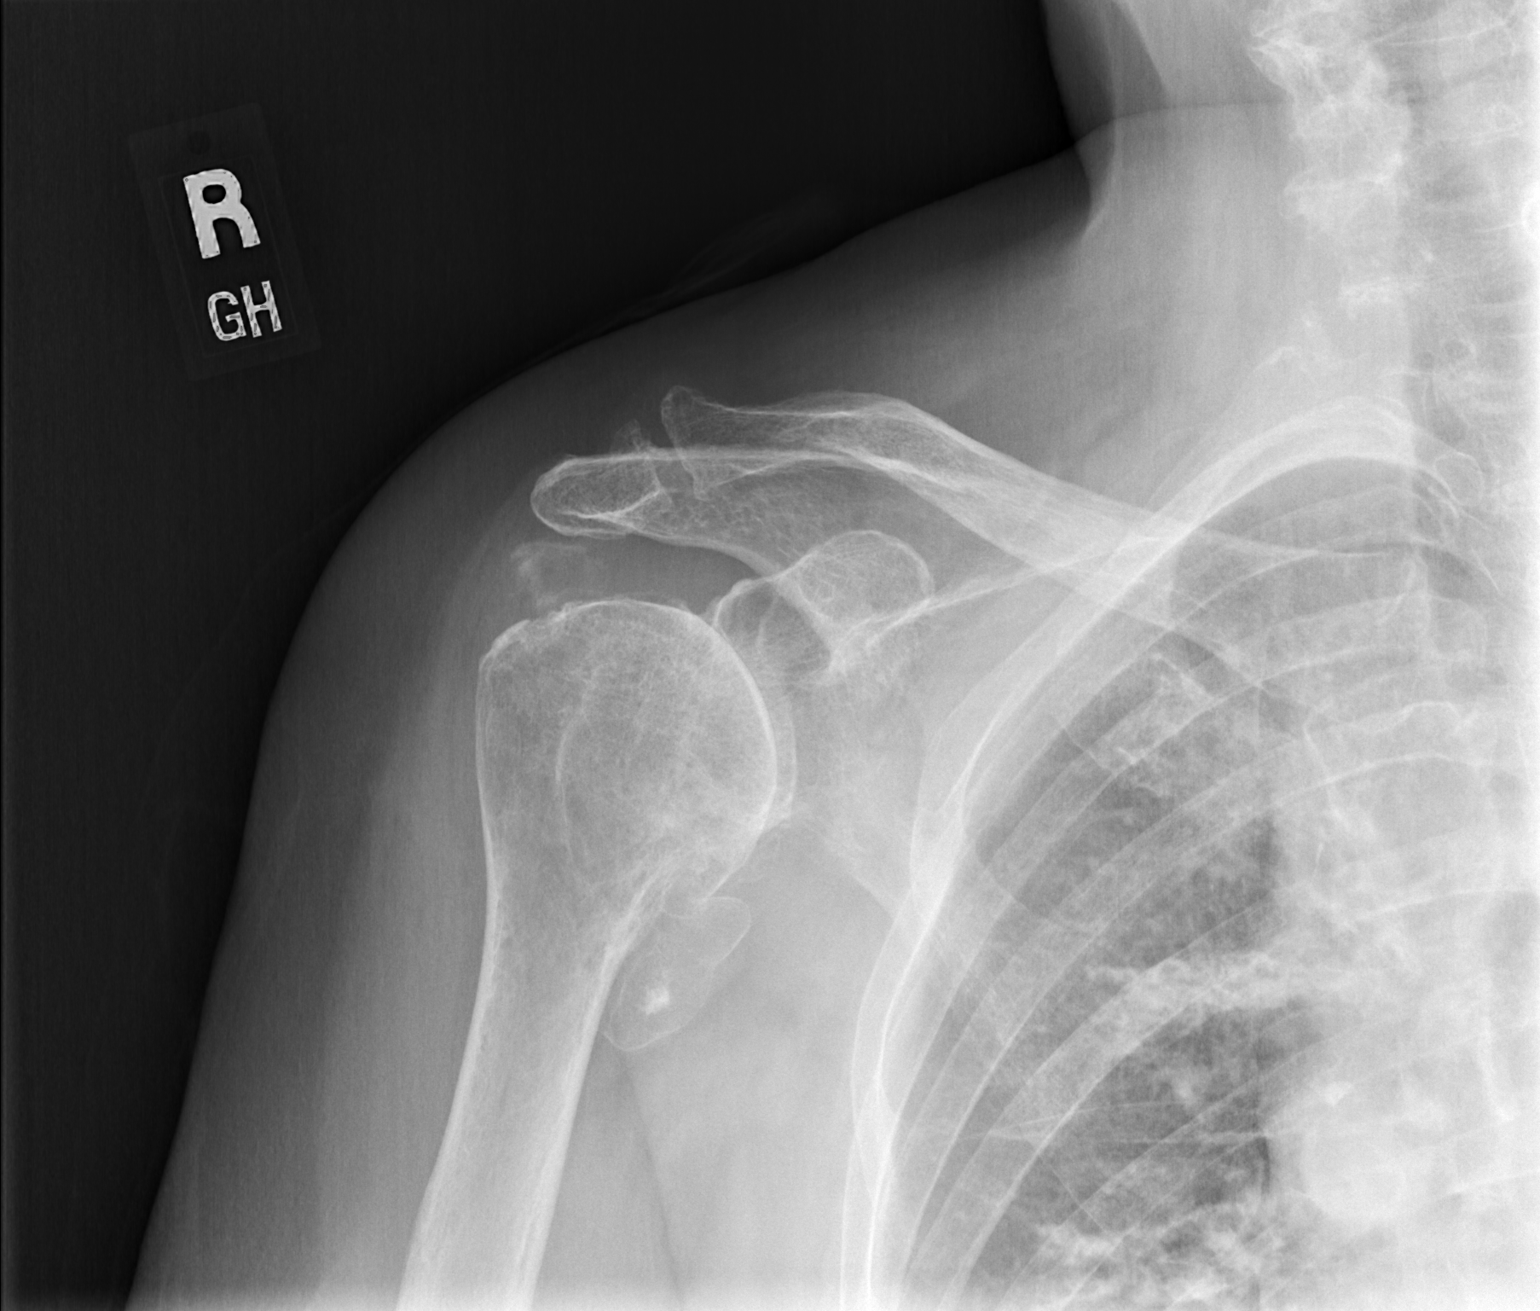

[w shoulder y view right *]
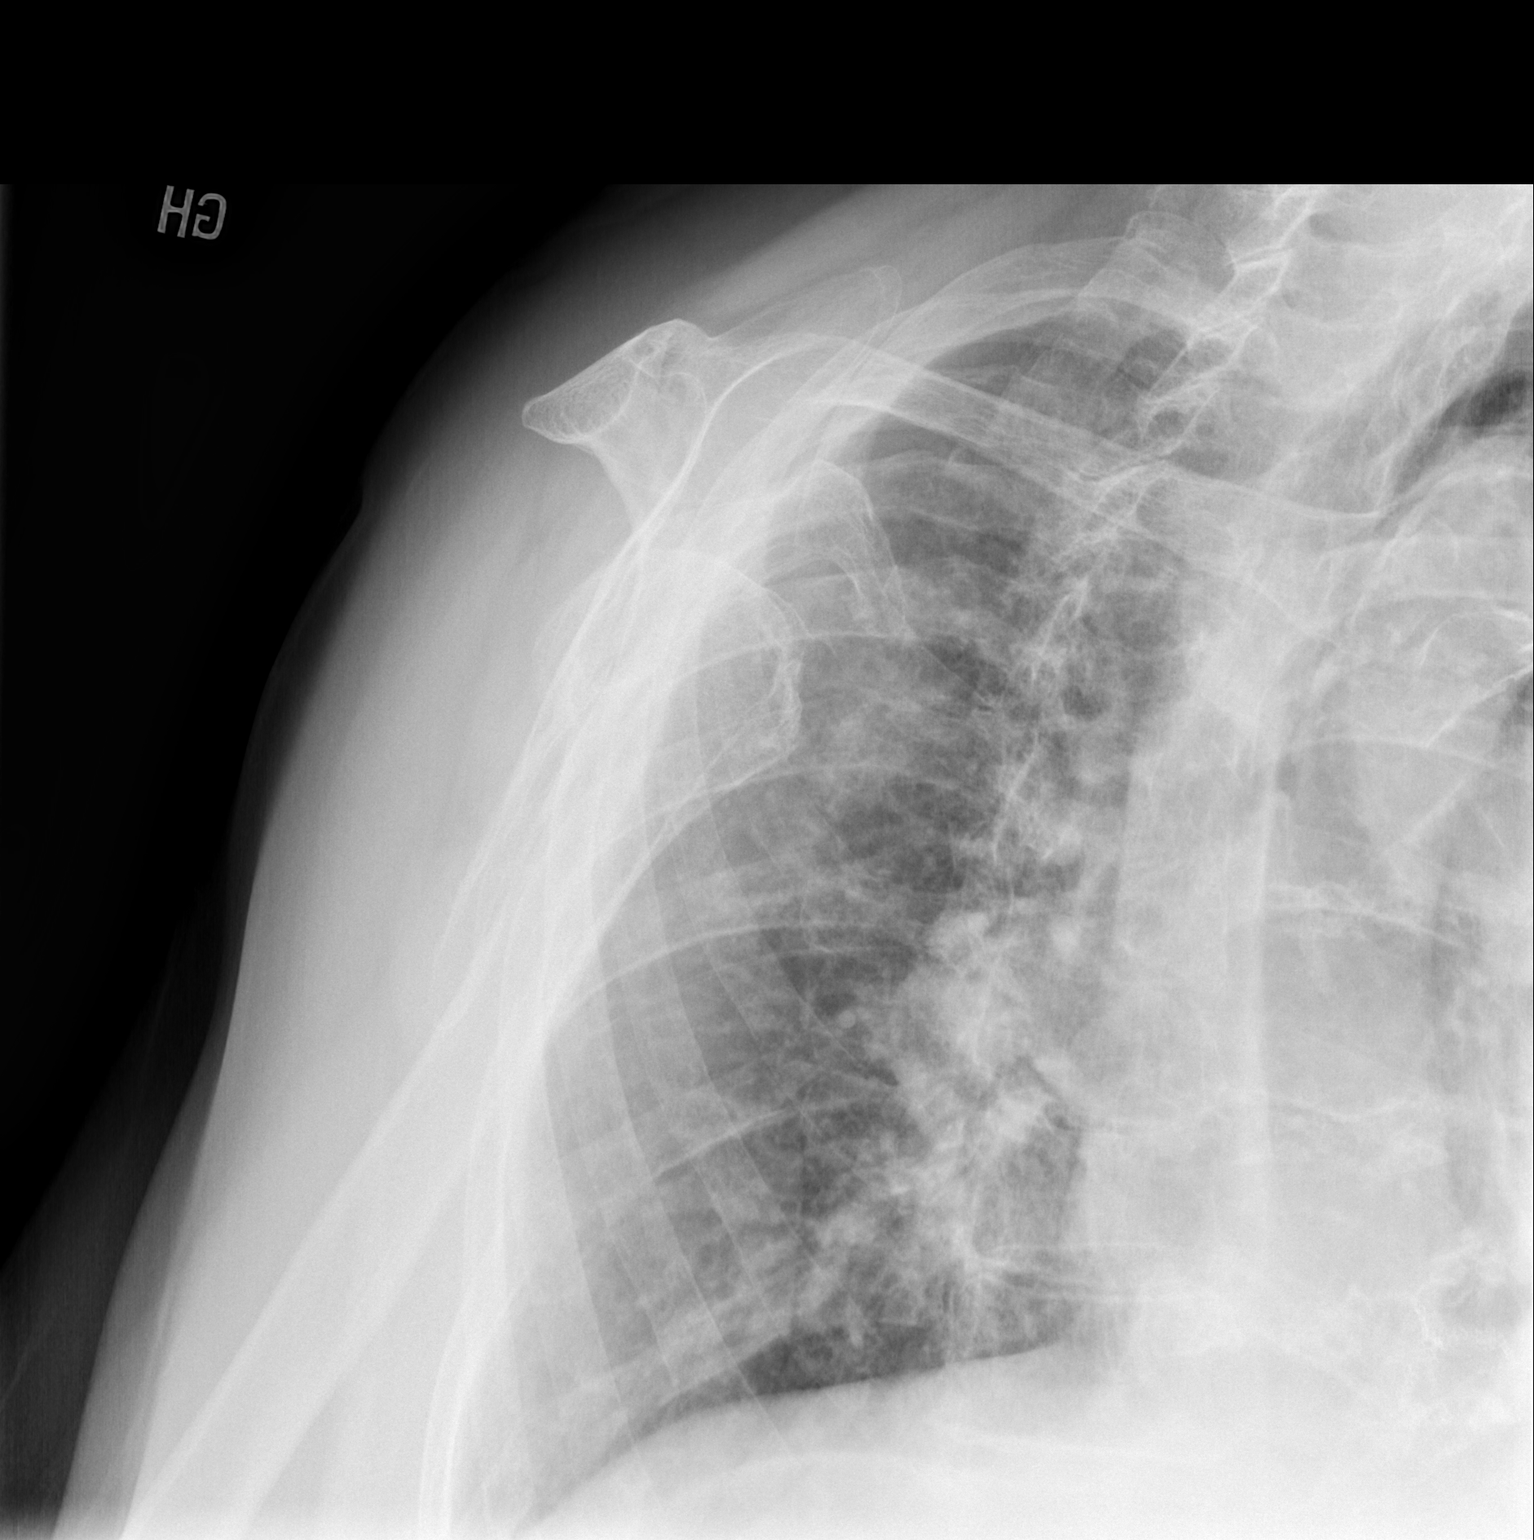

[w shoulder axillary right *]
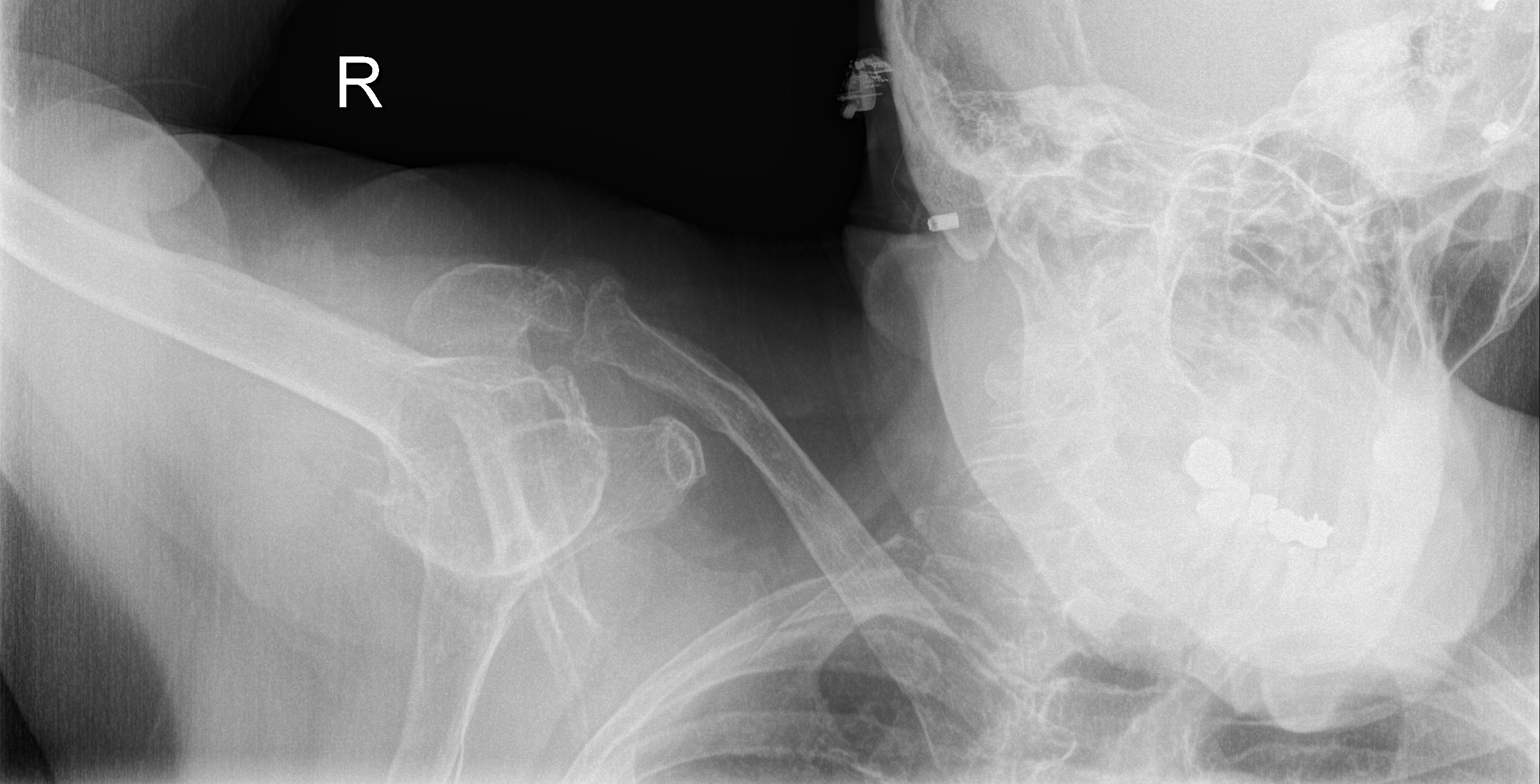

[3 of 3 positions shown; findings below may reference images not displayed]

FINDINGS: There is no acute fracture or dislocation. The bones are osteopenic.
Degenerative changes of the right shoulder with joint space
narrowing and osteophyte. The soft tissues are unremarkable.
IMPRESSION: 1. No acute fracture or dislocation.
2. Osteopenia and arthritic changes of the right shoulder.

## 2021-10-05 IMAGING — CR DG CERVICAL SPINE 2 OR 3 VIEWS
3 series · 3 of 3 positions shown · non-contrast
Comparison: None.

CLINICAL DATA: Neck pain.

EXAM:
CERVICAL SPINE - 2-3 VIEW

[w c-spine lat]
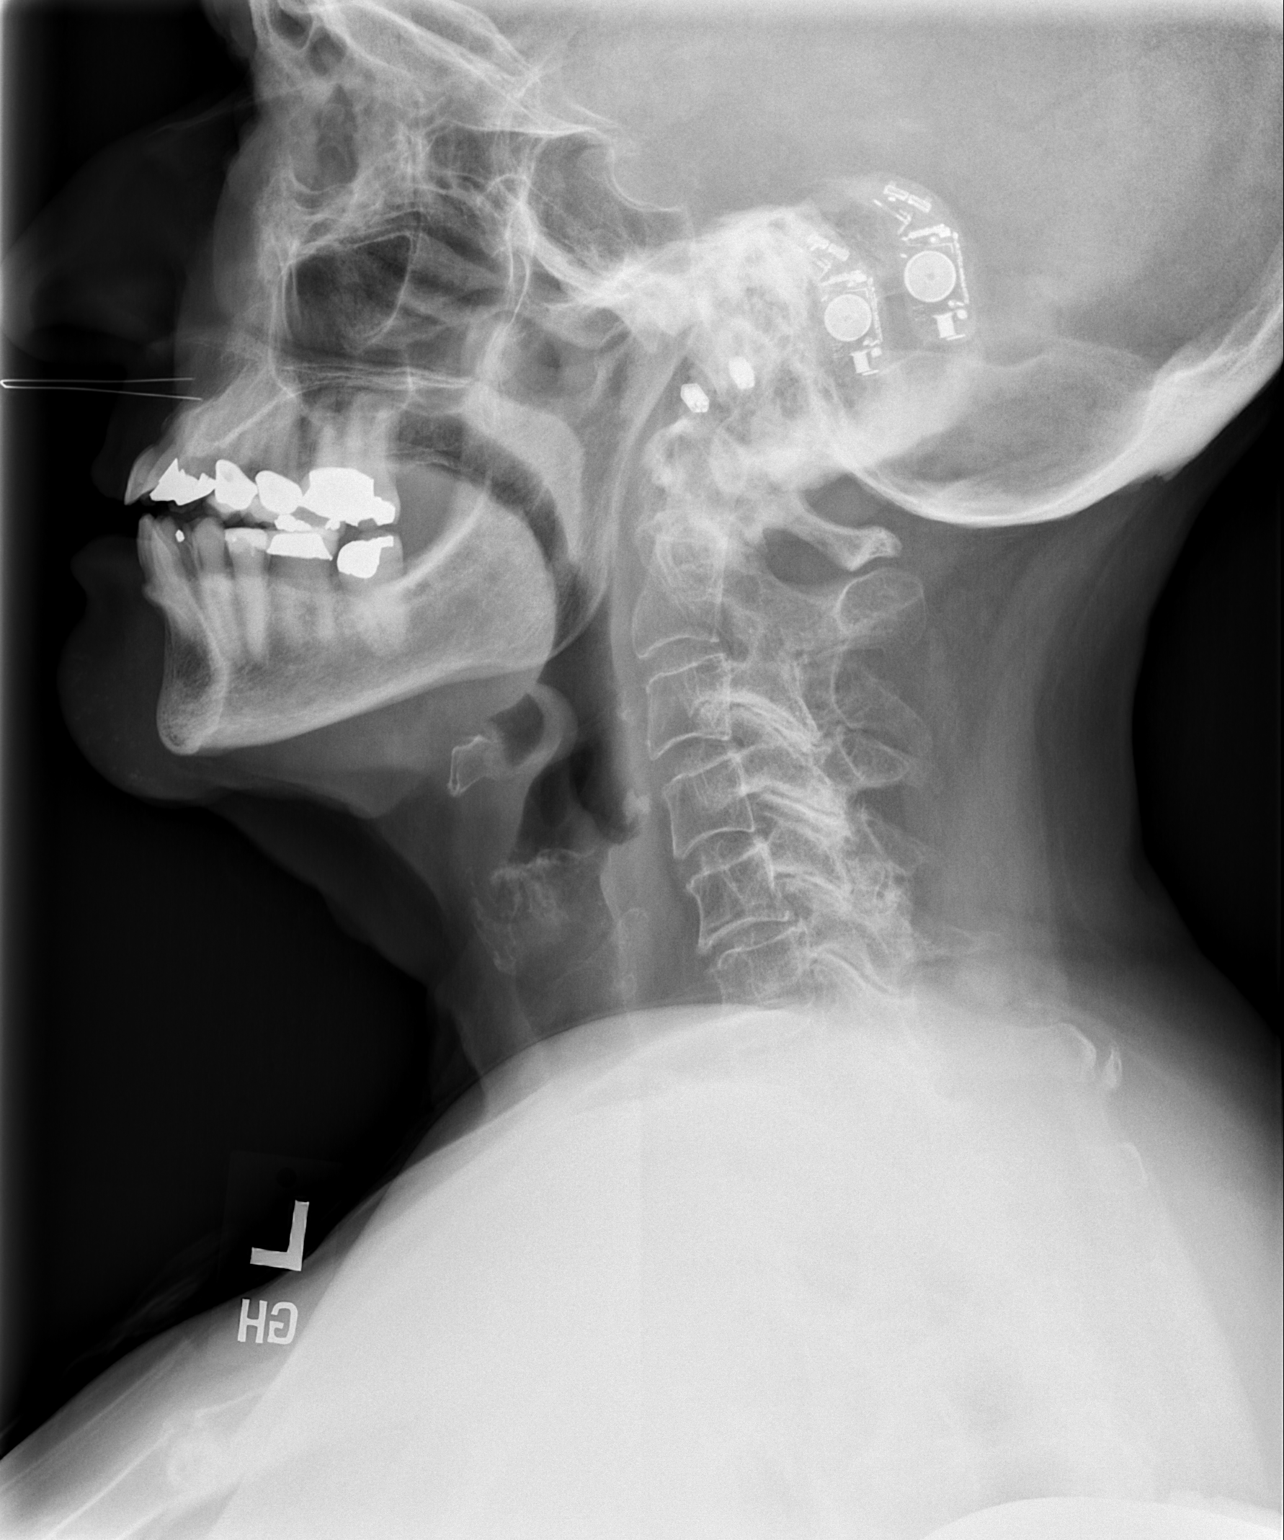

[w c-spine a.p. *]
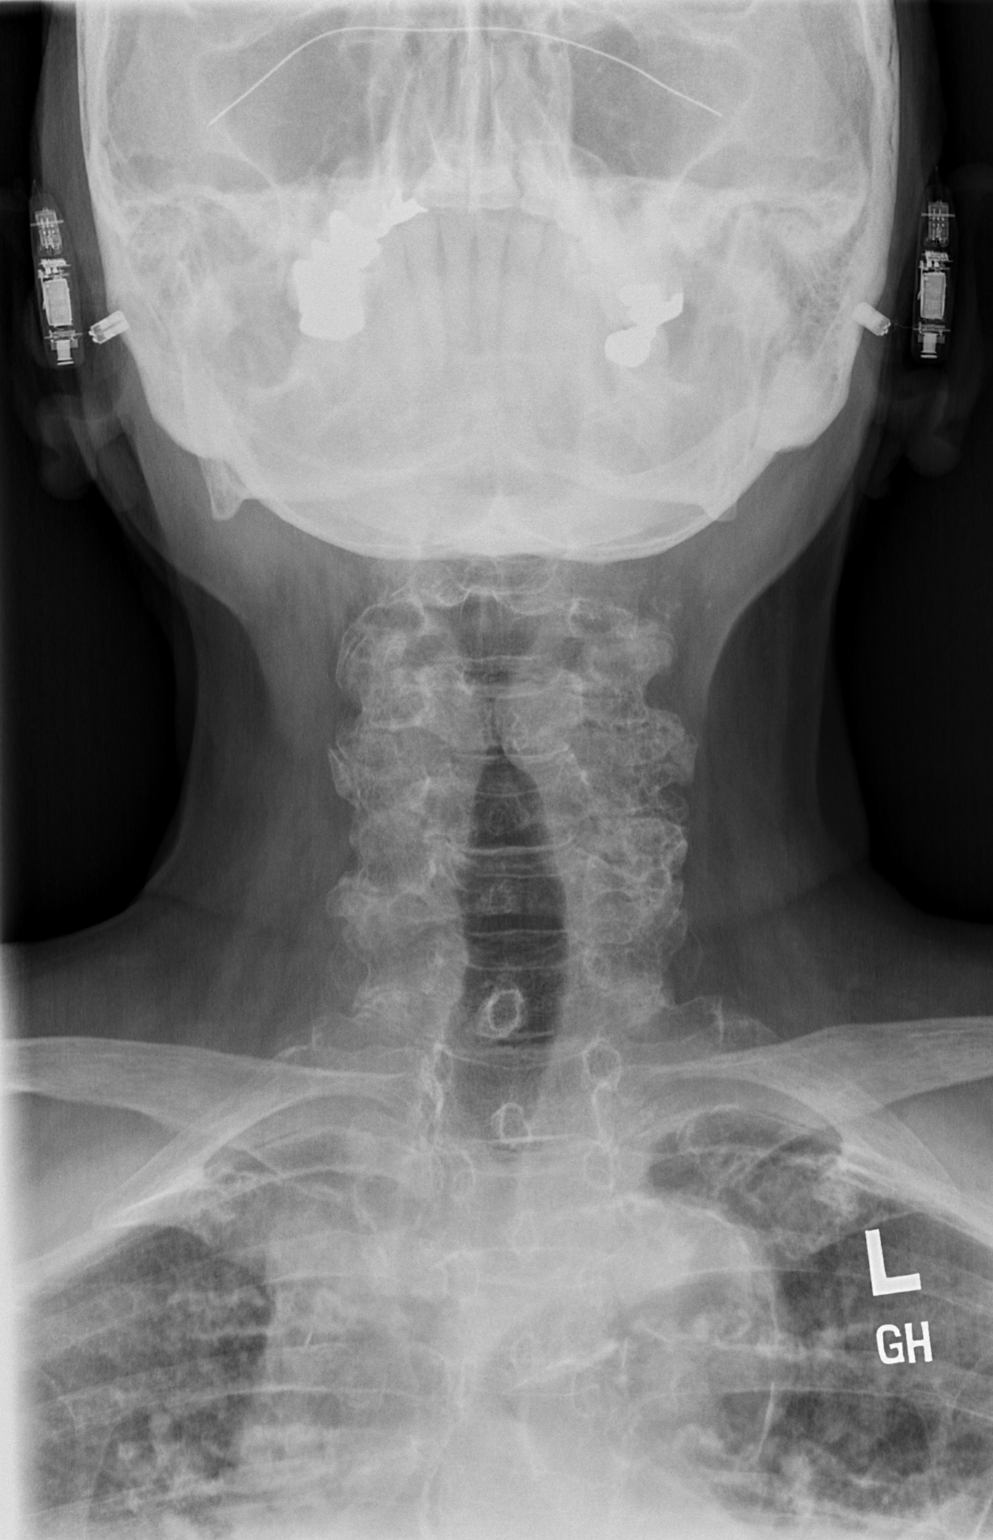

[w c-spine odontoid *]
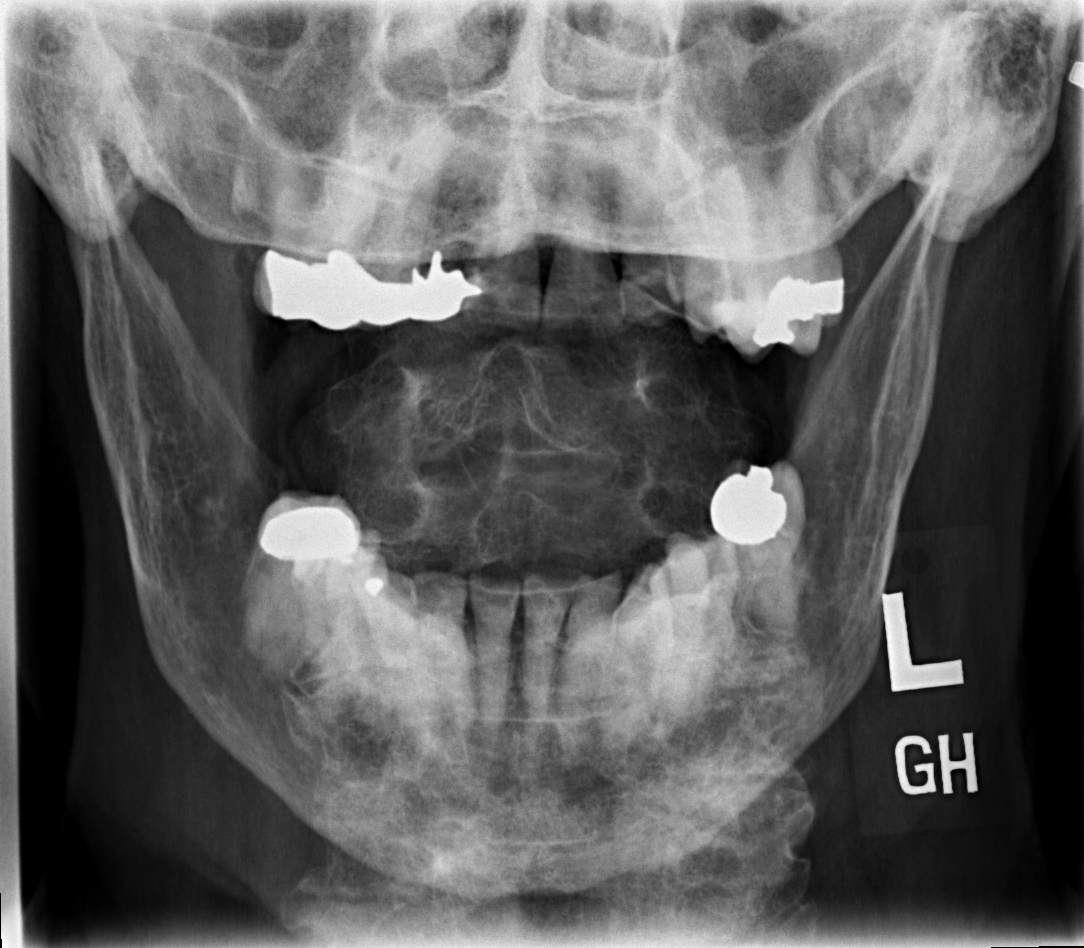

[3 of 3 positions shown; findings below may reference images not displayed]

FINDINGS: There is no acute fracture or subluxation of the cervical spine. The
bones are osteopenic. Multilevel degenerative changes and facet
arthropathy. The soft tissues are unremarkable
IMPRESSION: No acute/traumatic cervical spine pathology.

## 2021-10-05 IMAGING — CR DG HUMERUS 2V *R*
2 series · 2 of 2 positions shown · non-contrast
Comparison: None.

CLINICAL DATA: Right shoulder pain after fall.

EXAM:
RIGHT SHOULDER - 2+ VIEW; RIGHT HUMERUS - 2+ VIEW

[w humerus ap right *]
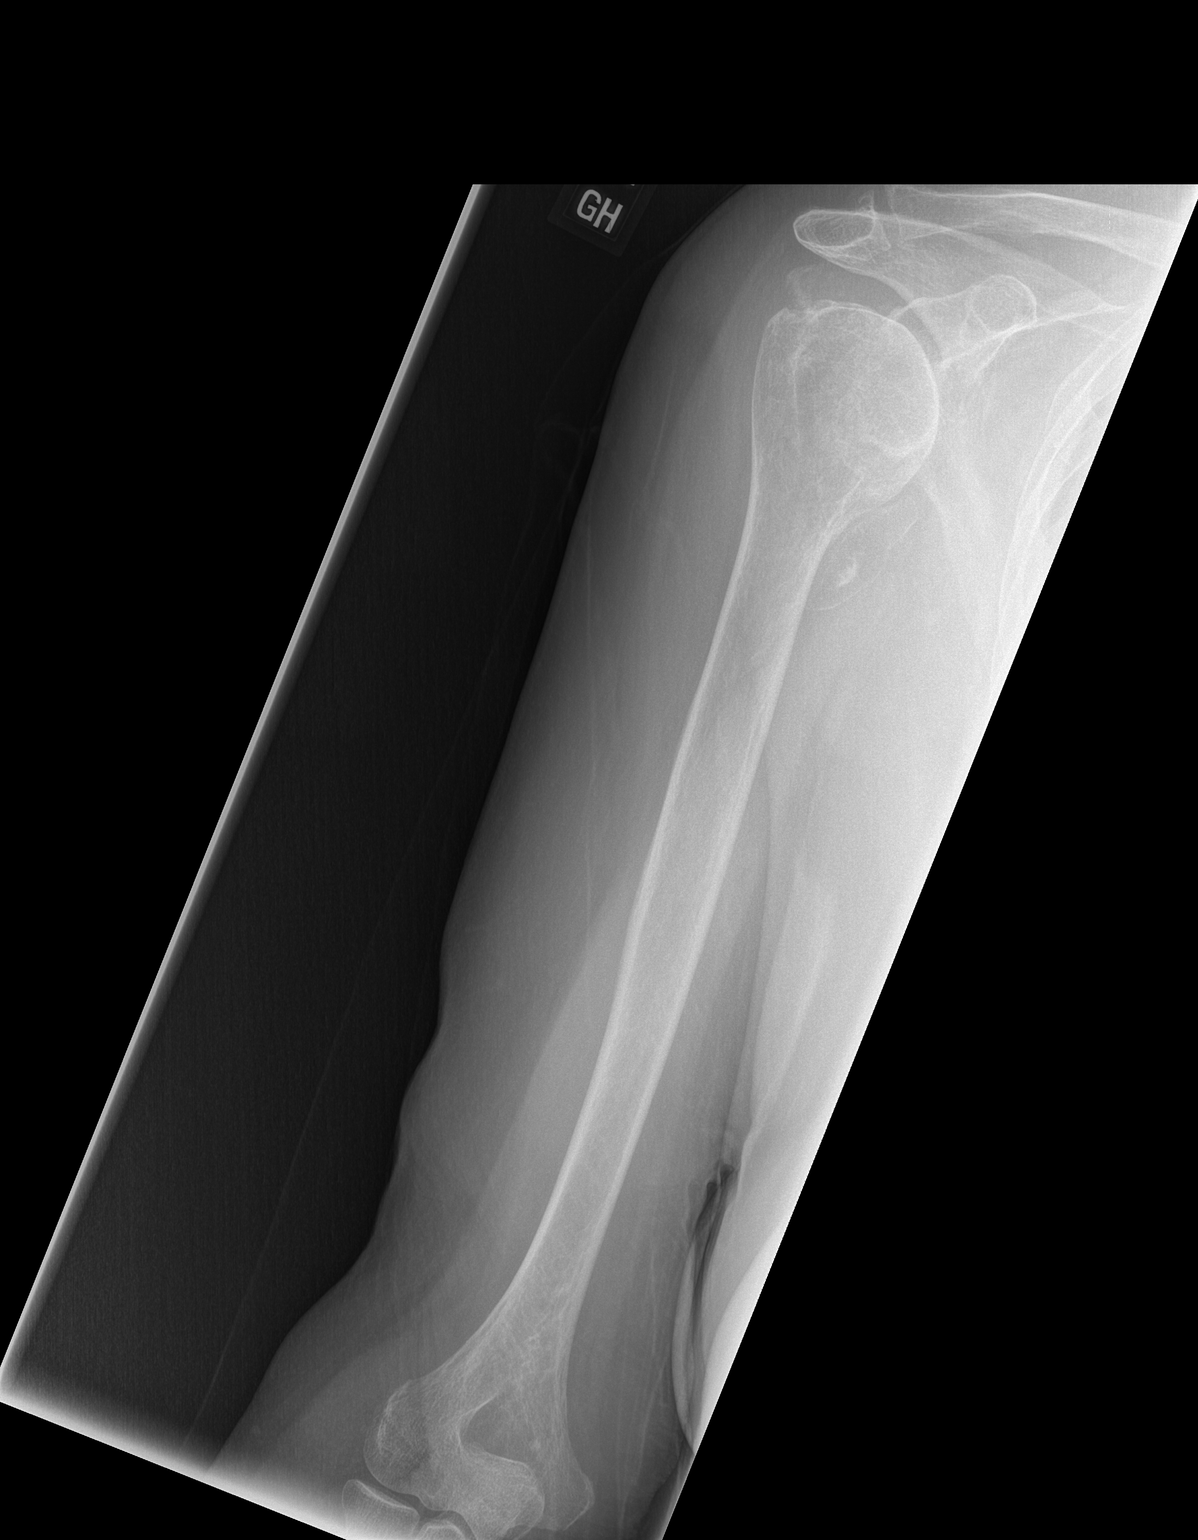

[w humerus lat right *]
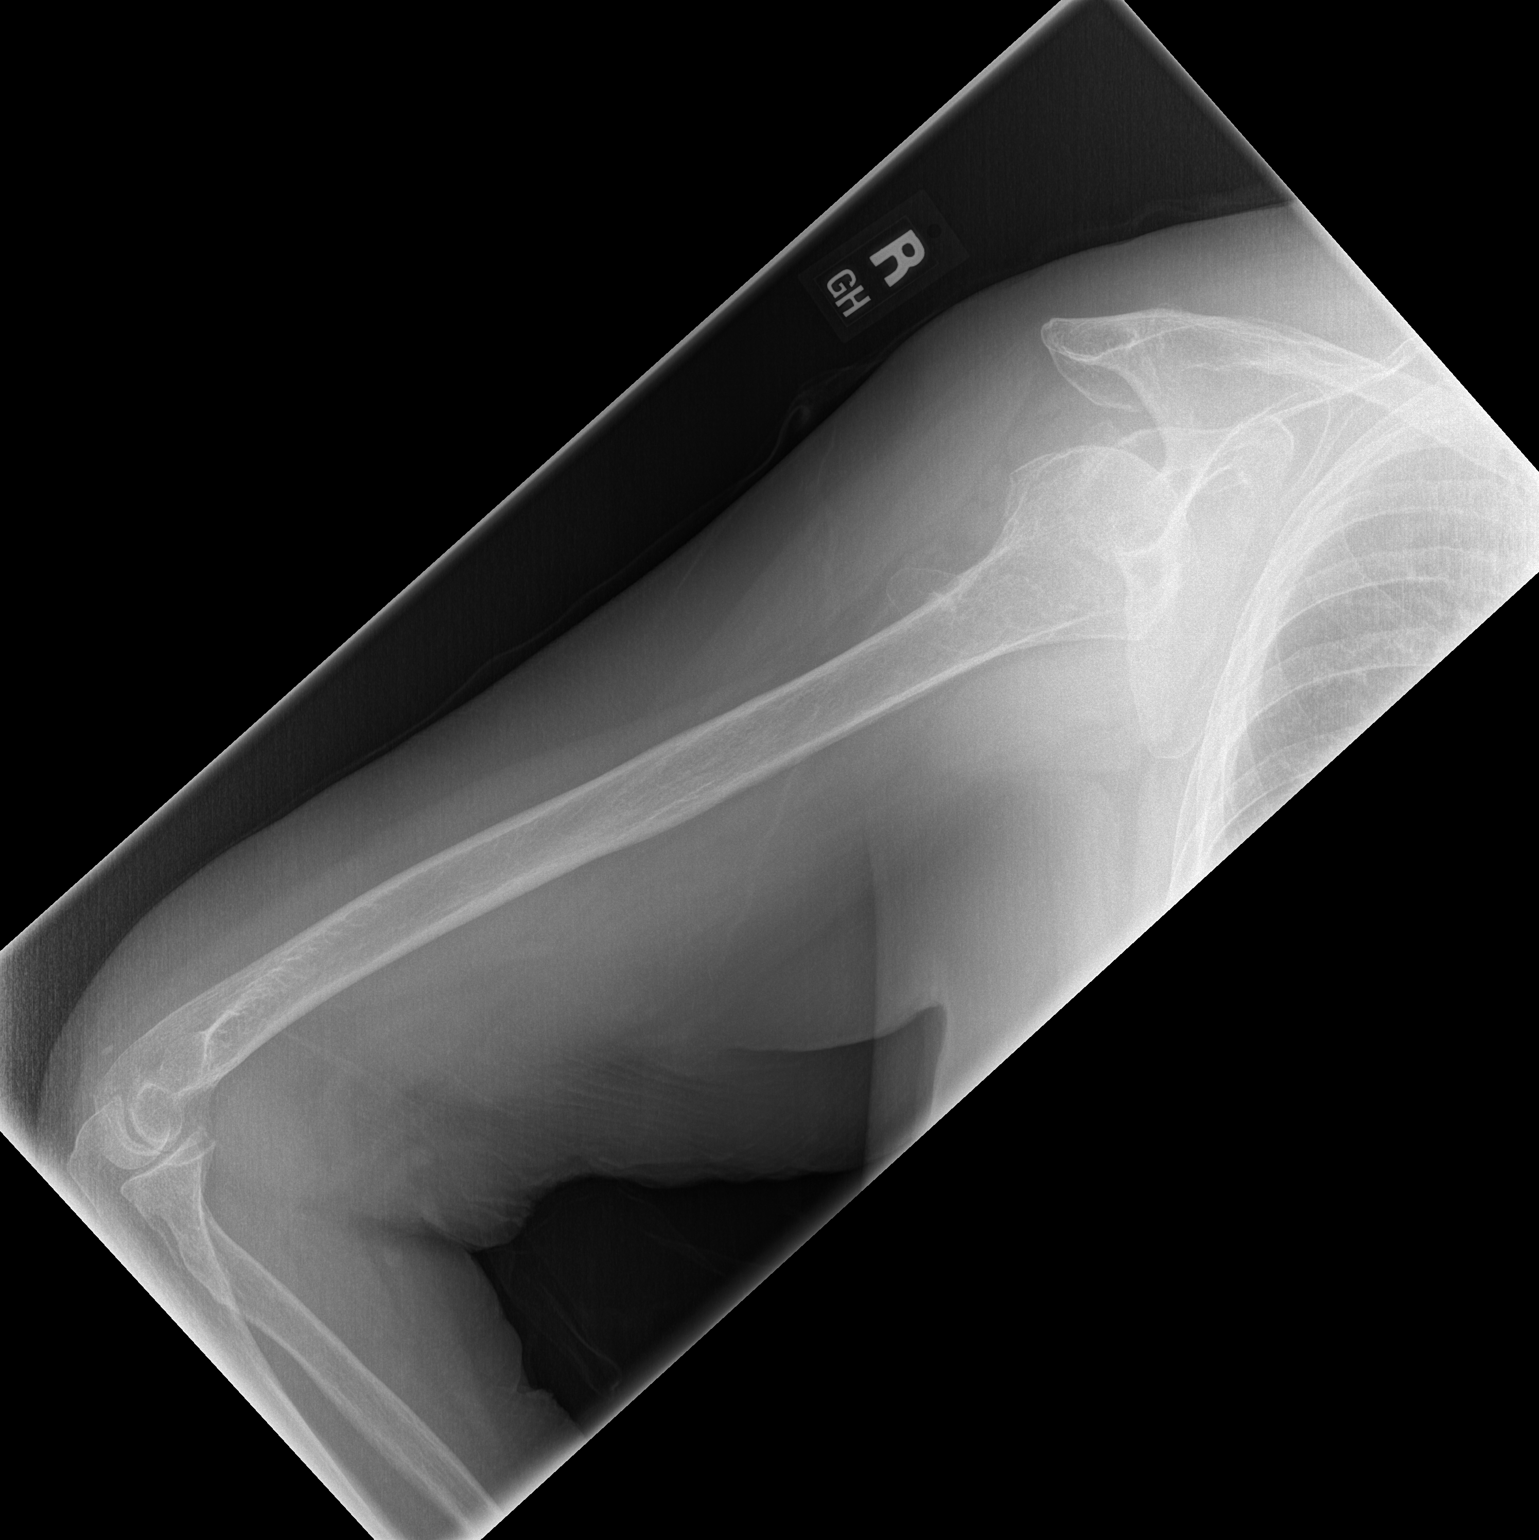

[2 of 2 positions shown; findings below may reference images not displayed]

FINDINGS: There is no acute fracture or dislocation. The bones are osteopenic.
Degenerative changes of the right shoulder with joint space
narrowing and osteophyte. The soft tissues are unremarkable.
IMPRESSION: 1. No acute fracture or dislocation.
2. Osteopenia and arthritic changes of the right shoulder.

## 2021-11-03 ENCOUNTER — Other Ambulatory Visit: Payer: Self-pay | Admitting: Family Medicine

## 2021-11-03 DIAGNOSIS — R062 Wheezing: Secondary | ICD-10-CM

## 2021-11-10 ENCOUNTER — Ambulatory Visit
Admission: RE | Admit: 2021-11-10 | Discharge: 2021-11-10 | Disposition: A | Discharge status: Home / Self Care | Payer: Medicare Other | Source: Ambulatory Visit | Attending: Family Medicine | Admitting: Family Medicine

## 2021-11-10 DIAGNOSIS — R062 Wheezing: Secondary | ICD-10-CM

## 2021-12-02 ENCOUNTER — Other Ambulatory Visit: Payer: Medicare Other

## 2021-12-13 ENCOUNTER — Other Ambulatory Visit (HOSPITAL_BASED_OUTPATIENT_CLINIC_OR_DEPARTMENT_OTHER): Payer: Self-pay | Admitting: Family Medicine

## 2021-12-13 DIAGNOSIS — W19XXXD Unspecified fall, subsequent encounter: Secondary | ICD-10-CM

## 2021-12-14 ENCOUNTER — Telehealth (HOSPITAL_BASED_OUTPATIENT_CLINIC_OR_DEPARTMENT_OTHER): Payer: Self-pay

## 2021-12-18 ENCOUNTER — Ambulatory Visit (HOSPITAL_BASED_OUTPATIENT_CLINIC_OR_DEPARTMENT_OTHER)
Admission: RE | Admit: 2021-12-18 | Discharge: 2021-12-18 | Disposition: A | Discharge status: Home / Self Care | Payer: Medicare Other | Source: Ambulatory Visit | Attending: Family Medicine | Admitting: Family Medicine

## 2021-12-18 DIAGNOSIS — M25561 Pain in right knee: Secondary | ICD-10-CM | POA: Diagnosis not present

## 2021-12-18 DIAGNOSIS — M25361 Other instability, right knee: Secondary | ICD-10-CM | POA: Insufficient documentation

## 2021-12-18 DIAGNOSIS — Z96651 Presence of right artificial knee joint: Secondary | ICD-10-CM | POA: Insufficient documentation

## 2021-12-18 DIAGNOSIS — W19XXXD Unspecified fall, subsequent encounter: Secondary | ICD-10-CM | POA: Insufficient documentation

## 2022-01-18 ENCOUNTER — Other Ambulatory Visit: Payer: Self-pay | Admitting: Internal Medicine

## 2022-01-19 ENCOUNTER — Other Ambulatory Visit: Payer: Self-pay | Admitting: Internal Medicine

## 2022-01-19 DIAGNOSIS — J329 Chronic sinusitis, unspecified: Secondary | ICD-10-CM

## 2022-01-19 DIAGNOSIS — R0981 Nasal congestion: Secondary | ICD-10-CM

## 2022-01-28 ENCOUNTER — Ambulatory Visit
Admission: RE | Admit: 2022-01-28 | Discharge: 2022-01-28 | Disposition: A | Discharge status: Home / Self Care | Payer: Medicare Other | Source: Ambulatory Visit | Attending: Internal Medicine | Admitting: Internal Medicine

## 2022-01-28 DIAGNOSIS — J329 Chronic sinusitis, unspecified: Secondary | ICD-10-CM

## 2022-01-28 DIAGNOSIS — R0981 Nasal congestion: Secondary | ICD-10-CM

## 2022-05-02 ENCOUNTER — Encounter (HOSPITAL_COMMUNITY): Payer: Self-pay | Admitting: Certified Registered"

## 2022-05-02 ENCOUNTER — Other Ambulatory Visit: Payer: Self-pay

## 2022-05-02 ENCOUNTER — Emergency Department (HOSPITAL_COMMUNITY): Payer: Medicare Other

## 2022-05-02 ENCOUNTER — Inpatient Hospital Stay (HOSPITAL_COMMUNITY)
Admission: EM | Admit: 2022-05-02 | Discharge: 2022-05-04 | DRG: 378 | Disposition: A | Discharge status: Home / Self Care | Payer: Medicare Other | Attending: Internal Medicine | Admitting: Internal Medicine

## 2022-05-02 ENCOUNTER — Encounter (HOSPITAL_COMMUNITY): Payer: Self-pay

## 2022-05-02 DIAGNOSIS — Z7951 Long term (current) use of inhaled steroids: Secondary | ICD-10-CM

## 2022-05-02 DIAGNOSIS — Z96649 Presence of unspecified artificial hip joint: Secondary | ICD-10-CM | POA: Diagnosis present

## 2022-05-02 DIAGNOSIS — J4489 Other specified chronic obstructive pulmonary disease: Secondary | ICD-10-CM | POA: Diagnosis present

## 2022-05-02 DIAGNOSIS — I10 Essential (primary) hypertension: Secondary | ICD-10-CM | POA: Diagnosis present

## 2022-05-02 DIAGNOSIS — M160 Bilateral primary osteoarthritis of hip: Secondary | ICD-10-CM | POA: Diagnosis present

## 2022-05-02 DIAGNOSIS — Q398 Other congenital malformations of esophagus: Secondary | ICD-10-CM

## 2022-05-02 DIAGNOSIS — Z86006 Personal history of melanoma in-situ: Secondary | ICD-10-CM

## 2022-05-02 DIAGNOSIS — H9190 Unspecified hearing loss, unspecified ear: Secondary | ICD-10-CM | POA: Diagnosis present

## 2022-05-02 DIAGNOSIS — I951 Orthostatic hypotension: Secondary | ICD-10-CM | POA: Diagnosis not present

## 2022-05-02 DIAGNOSIS — J449 Chronic obstructive pulmonary disease, unspecified: Secondary | ICD-10-CM

## 2022-05-02 DIAGNOSIS — K589 Irritable bowel syndrome without diarrhea: Secondary | ICD-10-CM | POA: Diagnosis present

## 2022-05-02 DIAGNOSIS — Z79899 Other long term (current) drug therapy: Secondary | ICD-10-CM

## 2022-05-02 DIAGNOSIS — Z8249 Family history of ischemic heart disease and other diseases of the circulatory system: Secondary | ICD-10-CM

## 2022-05-02 DIAGNOSIS — M19011 Primary osteoarthritis, right shoulder: Secondary | ICD-10-CM | POA: Diagnosis present

## 2022-05-02 DIAGNOSIS — K3189 Other diseases of stomach and duodenum: Secondary | ICD-10-CM | POA: Diagnosis present

## 2022-05-02 DIAGNOSIS — K920 Hematemesis: Secondary | ICD-10-CM | POA: Diagnosis not present

## 2022-05-02 DIAGNOSIS — E119 Type 2 diabetes mellitus without complications: Secondary | ICD-10-CM

## 2022-05-02 DIAGNOSIS — Z8719 Personal history of other diseases of the digestive system: Secondary | ICD-10-CM

## 2022-05-02 DIAGNOSIS — E039 Hypothyroidism, unspecified: Secondary | ICD-10-CM | POA: Diagnosis present

## 2022-05-02 DIAGNOSIS — E876 Hypokalemia: Secondary | ICD-10-CM | POA: Diagnosis not present

## 2022-05-02 DIAGNOSIS — Z980 Intestinal bypass and anastomosis status: Secondary | ICD-10-CM

## 2022-05-02 DIAGNOSIS — Z87891 Personal history of nicotine dependence: Secondary | ICD-10-CM

## 2022-05-02 DIAGNOSIS — Z1152 Encounter for screening for COVID-19: Secondary | ICD-10-CM

## 2022-05-02 DIAGNOSIS — F419 Anxiety disorder, unspecified: Secondary | ICD-10-CM | POA: Diagnosis present

## 2022-05-02 DIAGNOSIS — H8110 Benign paroxysmal vertigo, unspecified ear: Secondary | ICD-10-CM | POA: Diagnosis present

## 2022-05-02 DIAGNOSIS — Z79890 Hormone replacement therapy (postmenopausal): Secondary | ICD-10-CM

## 2022-05-02 DIAGNOSIS — M797 Fibromyalgia: Secondary | ICD-10-CM | POA: Diagnosis present

## 2022-05-02 DIAGNOSIS — K2100 Gastro-esophageal reflux disease with esophagitis, without bleeding: Secondary | ICD-10-CM | POA: Diagnosis present

## 2022-05-02 LAB — CBC WITH DIFFERENTIAL/PLATELET
Abs Immature Granulocytes: 0.07 10*3/uL (ref 0.00–0.07)
Basophils Absolute: 0 10*3/uL (ref 0.0–0.1)
Basophils Relative: 0 %
Eosinophils Absolute: 0 10*3/uL (ref 0.0–0.5)
Eosinophils Relative: 0 %
HCT: 45 % (ref 36.0–46.0)
Hemoglobin: 14.1 g/dL (ref 12.0–15.0)
Immature Granulocytes: 1 %
Lymphocytes Relative: 11 %
Lymphs Abs: 1.2 10*3/uL (ref 0.7–4.0)
MCH: 28.1 pg (ref 26.0–34.0)
MCHC: 31.3 g/dL (ref 30.0–36.0)
MCV: 89.6 fL (ref 80.0–100.0)
Monocytes Absolute: 0.5 10*3/uL (ref 0.1–1.0)
Monocytes Relative: 4 %
Neutro Abs: 9.2 10*3/uL — ABNORMAL HIGH (ref 1.7–7.7)
Neutrophils Relative %: 84 %
Platelets: 231 10*3/uL (ref 150–400)
RBC: 5.02 MIL/uL (ref 3.87–5.11)
RDW: 15.1 % (ref 11.5–15.5)
WBC: 11.1 10*3/uL — ABNORMAL HIGH (ref 4.0–10.5)
nRBC: 0 % (ref 0.0–0.2)

## 2022-05-02 LAB — PROTIME-INR
INR: 1.1 (ref 0.8–1.2)
Prothrombin Time: 14.1 seconds (ref 11.4–15.2)

## 2022-05-02 LAB — COMPREHENSIVE METABOLIC PANEL
ALT: 15 U/L (ref 0–44)
AST: 30 U/L (ref 15–41)
Albumin: 3.6 g/dL (ref 3.5–5.0)
Alkaline Phosphatase: 66 U/L (ref 38–126)
Anion gap: 10 (ref 5–15)
BUN: 25 mg/dL — ABNORMAL HIGH (ref 8–23)
CO2: 22 mmol/L (ref 22–32)
Calcium: 9.2 mg/dL (ref 8.9–10.3)
Chloride: 107 mmol/L (ref 98–111)
Creatinine, Ser: 0.77 mg/dL (ref 0.44–1.00)
GFR, Estimated: >60 mL/min (ref >60)
Glucose, Bld: 177 mg/dL — ABNORMAL HIGH (ref 70–99)
Potassium: 3.7 mmol/L (ref 3.5–5.1)
Sodium: 139 mmol/L (ref 135–145)
Total Bilirubin: 1.2 mg/dL (ref 0.3–1.2)
Total Protein: 6.4 g/dL — ABNORMAL LOW (ref 6.5–8.1)

## 2022-05-02 LAB — LIPASE, BLOOD: Lipase: 27 U/L (ref 11–51)

## 2022-05-02 LAB — GLUCOSE, CAPILLARY
Glucose-Capillary: 140 mg/dL — ABNORMAL HIGH (ref 70–99)
Glucose-Capillary: 115 mg/dL — ABNORMAL HIGH (ref 70–99)

## 2022-05-02 LAB — TYPE AND SCREEN
ABO/RH(D): A POS
Antibody Screen: POSITIVE
PT AG Type: NEGATIVE

## 2022-05-02 LAB — HEMOGLOBIN A1C
Hgb A1c MFr Bld: 5.8 % — ABNORMAL HIGH (ref 4.8–5.6)
Mean Plasma Glucose: 119.76 mg/dL

## 2022-05-02 LAB — CBG MONITORING, ED: Glucose-Capillary: 123 mg/dL — ABNORMAL HIGH (ref 70–99)

## 2022-05-02 LAB — POC OCCULT BLOOD, ED: Fecal Occult Bld: NEGATIVE

## 2022-05-02 MED ORDER — METOCLOPRAMIDE HCL 5 MG/ML IJ SOLN
10.0000 mg | Freq: Once | INTRAMUSCULAR | Status: AC
  Administered 2022-05-02: 10 mg via INTRAVENOUS
  Filled 2022-05-02: qty 2

## 2022-05-02 MED ORDER — PANTOPRAZOLE SODIUM 40 MG IV SOLR
40.0000 mg | Freq: Once | INTRAVENOUS | Status: AC
  Administered 2022-05-02: 40 mg via INTRAVENOUS
  Filled 2022-05-02: qty 10

## 2022-05-02 MED ORDER — SODIUM CHLORIDE 0.9 % IV SOLN: INTRAVENOUS | Status: DC

## 2022-05-02 MED ORDER — IOHEXOL 300 MG/ML  SOLN
100.0000 mL | Freq: Once | INTRAMUSCULAR | Status: AC | PRN
  Administered 2022-05-02: 100 mL via INTRAVENOUS

## 2022-05-02 MED ORDER — ONDANSETRON HCL 4 MG/2ML IJ SOLN
4.0000 mg | Freq: Once | INTRAMUSCULAR | Status: AC
  Administered 2022-05-02: 4 mg via INTRAVENOUS
  Filled 2022-05-02: qty 2

## 2022-05-02 MED ORDER — METOPROLOL TARTRATE 5 MG/5ML IV SOLN: 5.0000 mg | Freq: Four times a day (QID) | INTRAVENOUS | Status: DC | PRN

## 2022-05-02 MED ORDER — SODIUM CHLORIDE 0.9 % IV BOLUS
500.0000 mL | Freq: Once | INTRAVENOUS | Status: AC
  Administered 2022-05-02: 500 mL via INTRAVENOUS

## 2022-05-02 MED ORDER — INSULIN ASPART 100 UNIT/ML IJ SOLN
0.0000 [IU] | Freq: Every day | INTRAMUSCULAR | Status: DC
  Filled 2022-05-02: qty 0.05

## 2022-05-02 MED ORDER — PROCHLORPERAZINE EDISYLATE 10 MG/2ML IJ SOLN
10.0000 mg | Freq: Four times a day (QID) | INTRAMUSCULAR | Status: DC | PRN
  Administered 2022-05-02: 10 mg via INTRAVENOUS
  Filled 2022-05-02: qty 2

## 2022-05-02 MED ORDER — ONDANSETRON HCL 4 MG/2ML IJ SOLN
4.0000 mg | Freq: Four times a day (QID) | INTRAMUSCULAR | Status: AC
  Administered 2022-05-02 – 2022-05-03 (×4): 4 mg via INTRAVENOUS
  Filled 2022-05-02 (×4): qty 2

## 2022-05-02 MED ORDER — INSULIN ASPART 100 UNIT/ML IJ SOLN
0.0000 [IU] | Freq: Three times a day (TID) | INTRAMUSCULAR | Status: DC
  Administered 2022-05-02: 2 [IU] via SUBCUTANEOUS
  Filled 2022-05-02: qty 0.15

## 2022-05-02 MED ORDER — PANTOPRAZOLE SODIUM 40 MG IV SOLR
40.0000 mg | Freq: Two times a day (BID) | INTRAVENOUS | Status: DC
  Administered 2022-05-02 – 2022-05-04 (×4): 40 mg via INTRAVENOUS
  Filled 2022-05-02 (×4): qty 10

## 2022-05-02 NOTE — ED Provider Notes (Signed)
[facility] DEPT Provider Note   CSN: [id number] Arrival date & time: 05/02/22  0005     History  Chief Complaint  Patient presents with   Hematemesis    Teresia Myint is a 88 y.o. female with a PMHx of diverticulitis, IBS, who presents to the ED with concerns for hematemesis onset 5:30 AM 2 days. Notes that her vomit was initially red and is now tan/brown with mucus. Has history of similar symptoms while in [place]. Has a GI specialists with [facility]. Has associated nausea and vomiting. No meds tried. No anticoagulants at this time. Denies fever, chest pain, shortness of breath, abdominal pain.    Per pt chart review: Pt was evaluated with an EGD on 09/2014.    The history is provided by the patient. No language interpreter was used.       Home Medications Prior to Admission medications   Medication Sig Start Date End Date Taking? Authorizing Provider  albuterol (VENTOLIN HFA) 108 (90 Base) MCG/ACT inhaler Inhale into the lungs every 6 (six) hours as needed for wheezing or shortness of breath.    [provider]  ALPRAZolam Duanne Moron) 0.25 MG tablet Take 0.25 mg by mouth at bedtime as needed for anxiety.    [provider]  AMLODIPINE BESYLATE PO Take 10 mg by mouth.    [provider]  ascorbic acid (VITAMIN C) 500 MG tablet Take 500 mg by mouth daily.    [provider]  bisacodyl (BISACODYL) 5 MG EC tablet Take 10 mg by mouth daily as needed for moderate constipation.    [provider]  Cholecalciferol (VITAMIN D3) 25 MCG (1000 UT) CAPS Take by mouth.    [provider]  dicyclomine (BENTYL) 20 MG tablet Take 20 mg by mouth every 6 (six) hours.    [provider]  esomeprazole (NEXIUM) 20 MG capsule Take 20 mg by mouth daily at 12 noon.    [provider]  fluticasone (FLONASE) 50 MCG/ACT nasal spray Place into both nostrils daily.    [provider]   Fluticasone-Salmeterol (ADVAIR) 100-50 MCG/DOSE AEPB Inhale 1 puff into the lungs 2 (two) times daily.    [provider]  folic acid (FOLVITE) 332 MCG tablet Take 400 mcg by mouth daily.    [provider]  HYOSCYAMINE SULFATE PO Take 0.125 mg by mouth.    [provider]  lamoTRIgine (LAMICTAL) 25 MG tablet Take 25 mg by mouth daily.    [provider]  levothyroxine (SYNTHROID) 150 MCG tablet Take 150 mcg by mouth daily before breakfast.    [provider]  Loperamide HCl (IMODIUM A-D PO) Take by mouth.    [provider]  losartan (COZAAR) 25 MG tablet Take 25 mg by mouth daily.    [provider]  Magnesium 500 MG CAPS Take by mouth.    [provider]  MULTIPLE VITAMIN PO Take by mouth.    [provider]  naproxen (NAPROSYN) 500 MG tablet Take 500 mg by mouth 2 (two) times daily with a meal.    [provider]  Niacin (VITAMIN B-3 PO) Take 25 mcg by mouth.    [provider]  potassium chloride (KLOR-CON) 10 MEQ tablet Take 10 mEq by mouth once.    [provider]  pyridOXINE (VITAMIN B-6) 100 MG tablet Take 100 mg by mouth daily.    [provider]  sertraline (ZOLOFT) 100 MG tablet Take 100 mg by  mouth daily.    [provider]      Allergies    Codeine and Sulfasalazine    Review of Systems   Review of Systems  Constitutional:  Negative for fever.  Respiratory:  Negative for shortness of breath.   Cardiovascular:  Negative for chest pain.  Gastrointestinal:  Positive for nausea and vomiting. Negative for abdominal pain.  All other systems reviewed and are negative.   Physical Exam Updated Vital Signs BP (!) 155/89   Pulse 93   Temp 97.6 F (36.4 C) (Oral)   Resp 15   Ht '5\' 2"'$  (1.575 m)   Wt 77.1 kg   SpO2 97%   BMI 31.09 kg/m  Physical Exam Vitals and nursing note reviewed.  Constitutional:      General: She is not in acute distress.     Appearance: She is not diaphoretic.  HENT:     Head: Normocephalic and atraumatic.     Mouth/Throat:     Pharynx: No oropharyngeal exudate.  Eyes:     General: No scleral icterus.    Conjunctiva/sclera: Conjunctivae normal.  Cardiovascular:     Rate and Rhythm: Normal rate and regular rhythm.     Pulses: Normal pulses.     Heart sounds: Normal heart sounds.  Pulmonary:     Effort: Pulmonary effort is normal. No respiratory distress.     Breath sounds: Normal breath sounds. No wheezing.  Abdominal:     General: Bowel sounds are normal.     Palpations: Abdomen is soft. There is no mass.     Tenderness: There is generalized abdominal tenderness. There is no guarding or rebound.     Comments: Diffuse abdominal tenderness to palpation.  Musculoskeletal:        General: Normal range of motion.     Cervical back: Normal range of motion and neck supple.  Skin:    General: Skin is warm and dry.  Neurological:     Mental Status: She is alert.  Psychiatric:        Behavior: Behavior normal.     ED Results / Procedures / Treatments   Labs (all labs ordered are listed, but only abnormal results are displayed) Labs Reviewed  CBC WITH DIFFERENTIAL/PLATELET - Abnormal; Notable for the following components:      Result Value   WBC 11.1 (*)    Neutro Abs 9.2 (*)    All other components within normal limits  COMPREHENSIVE METABOLIC PANEL - Abnormal; Notable for the following components:   Glucose, Bld 177 (*)    BUN 25 (*)    Total Protein 6.4 (*)    All other components within normal limits  PROTIME-INR  LIPASE, BLOOD  PH, GASTRIC FLUID (GASTROCCULT CARD)  POC OCCULT BLOOD, ED  TYPE AND SCREEN    EKG EKG Interpretation  Date/Time:  Monday May 02 2022 00:59:20 EDT Ventricular Rate:  97 PR Interval:  186 QRS Duration: 93 QT Interval:  373 QTC Calculation: 474 R Axis:   -15 Text Interpretation: Sinus tachycardia Atrial premature complex Inferior infarct, old Anteroseptal  infarct, old Confirmed by Veryl Speak [phone]) on 05/02/2022 1:48:01 AM  Radiology CT ABDOMEN PELVIS W CONTRAST  Result Date: 05/02/2022 CLINICAL DATA:  Vomiting blood.  Abdominal pain. EXAM: CT ABDOMEN AND PELVIS WITH CONTRAST TECHNIQUE: Multidetector CT imaging of the abdomen and pelvis was performed using the standard protocol following bolus administration of intravenous contrast. RADIATION DOSE REDUCTION: This exam was performed according to the departmental dose-optimization program  which includes automated exposure control, adjustment of the mA and/or kV according to patient size and/or use of iterative reconstruction technique. CONTRAST:  143m OMNIPAQUE IOHEXOL 300 MG/ML  SOLN COMPARISON:  08/26/2020. FINDINGS: Lower chest: A architectural distortion and scarring noted both lung bases, similar to prior. No pleural effusion. Small hiatal hernia noted. Hepatobiliary: No suspicious focal abnormality within the liver parenchyma. Scattered calcified granulomata are evident within the liver parenchyma. Several 7-8 mm calcified gallstones noted without gallbladder wall thickening or pericholecystic fluid. No intrahepatic or extrahepatic biliary dilation. Pancreas: No focal mass lesion. No dilatation of the main duct. No intraparenchymal cyst. No peripancreatic edema. Spleen: Calcified granulomata noted within the spleen. Adrenals/Urinary Tract: No adrenal nodule or mass. Cortical scarring noted both kidneys stable 14 mm low-density lesion lower pole right kidney compatible with a cyst. 8 mm exophytic lesion anterior lower pole left kidney (delay image 20/series 7) has attenuation higher than would be expected for a simple cyst. No evidence for hydroureter. Bladder partially obscured by beam hardening artifact from right hip replacement with right-sided bladder diverticuli evident. Stomach/Bowel: Small hiatal hernia with surgical changes in the region of the esophagogastric junction. Duodenum is normally  positioned as is the ligament of Treitz. No small bowel wall thickening. No small bowel dilatation. The appendix is not well visualized, but there is no edema or inflammation in the region of the cecum. Diverticuli are seen scattered along the entire length of the colon without CT findings of diverticulitis. Extensive diverticular disease noted in the sigmoid colon possible tethering of the vaginal cuff to the sigmoid colonic segment. Vascular/Lymphatic: There is moderate atherosclerotic calcification of the abdominal aorta without aneurysm. There is no gastrohepatic or hepatoduodenal ligament lymphadenopathy. No retroperitoneal or mesenteric lymphadenopathy. No pelvic sidewall lymphadenopathy. Reproductive: Uterus surgically absent. Trace gas is identified in the vagina. There is no adnexal mass. Other: No intraperitoneal free fluid. Musculoskeletal: Left paraumbilical ventral hernia contains only fat with hernia sac not substantially changed measuring on the order of 5.0 x 6.1 x 3.4 cm. No fluid or edema in the hernia sac to suggest fat incarceration. Status post right total hip replacement. Advanced degenerative changes noted left hip. No worrisome lytic or sclerotic osseous abnormality. Stable L5 compression deformity. IMPRESSION: 1. No acute findings in the abdomen or pelvis. 2. Extensive diverticular disease in the colon, most advanced in the sigmoid segment. Possible tethering of the vaginal cuff to the sigmoid colonic segment with a trace amount a gas noted in the vaginal vault. Correlation for signs/symptoms of colovaginal fistula suggested. 3. Small hiatal hernia with surgical changes in the region of the esophagogastric junction. 4. Cholelithiasis. 5. 8 mm exophytic lesion anterior lower pole left kidney has attenuation higher than would be expected for a simple cyst. This may be a cyst complicated by proteinaceous debris or hemorrhage. Neoplasm not excluded. Follow-up could be used to ensure stability as  clinically warranted. 6. Stable paraumbilical ventral hernia containing only fat. No complicating features. 7. Stable L5 compression deformity. 8. Aortic Atherosclerosis (ICD10-I70.0). Electronically Signed   By: EMisty StanleyM.D.   On: 05/02/2022 05:54    Procedures Procedures    Medications Ordered in ED Medications  ondansetron (ZOFRAN) injection 4 mg (4 mg Intravenous Given 05/02/22 0310)  pantoprazole (PROTONIX) injection 40 mg (40 mg Intravenous Given 05/02/22 0310)  sodium chloride 0.9 % bolus 500 mL (0 mLs Intravenous Stopped 05/02/22 0452)  iohexol (OMNIPAQUE) 300 MG/ML solution 100 mL (100 mLs Intravenous Contrast Given 05/02/22 0522)  ondansetron (ZOFRAN)  injection 4 mg (4 mg Intravenous Given 05/02/22 0701)    ED Course/ Medical Decision Making/ A&P Clinical Course as of 05/02/22 4270  Mon May 02, 2022  0330 Discussed patient case with attending who agrees with treatment plan. [SB]  0350 Re-evaluated and patient noted improvement of symptoms with treatment regimen in the ED. [SB]  0617 Attending in to evaluate patient [SB]  0628 Consult with hospitalist, Dr. Bridgett Larsson who will have a patient evaluated by another specialist in the ED.  [SB]    Clinical Course User Index [SB] Vito Beg A, PA-C                           Medical Decision Making Amount and/or Complexity of Data Reviewed Labs: ordered. Radiology: ordered.  Risk Prescription drug management. Decision regarding hospitalization.   Pt presents with concerns for hematemesis onset 2 days.  Has a history of similar symptoms.  No meds tried prior to arrival.  Patient afebrile.  On exam patient with diffuse abdominal tenderness to palpation.  Oropharynx clear.  No acute cardiovascular respiratory exam findings.  Differential diagnosis includes upper GI bleed, gastritis, PUD.   Additional history obtained:  Additional history obtained from Spouse/Significant Other  Labs:  I ordered, and personally interpreted labs.   The pertinent results include:   POC occult negative. Coags unremarkable. Lipase unremarkable. CBC with mild elevated WBC at 11.1 otherwise unremarkable.  CMP with slightly elevated glucose at 177 otherwise unremarkable  Imaging: I ordered imaging studies including CT abdomen pelvis I independently visualized and interpreted imaging which showed:  1. No acute findings in the abdomen or pelvis.  2. Extensive diverticular disease in the colon, most advanced in the  sigmoid segment. Possible tethering of the vaginal cuff to the  sigmoid colonic segment with a trace amount a gas noted in the  vaginal vault. Correlation for signs/symptoms of colovaginal fistula  suggested.  3. Small hiatal hernia with surgical changes in the region of the  esophagogastric junction.  4. Cholelithiasis.  5. 8 mm exophytic lesion anterior lower pole left kidney has  attenuation higher than would be expected for a simple cyst. This  may be a cyst complicated by proteinaceous debris or hemorrhage.  Neoplasm not excluded. Follow-up could be used to ensure stability  as clinically warranted.  6. Stable paraumbilical ventral hernia containing only fat. No  complicating features.  7. Stable L5 compression deformity.  8. Aortic Atherosclerosis (ICD10-I70.0).   I agree with the radiologist interpretation  Medications:  I ordered medication including Zofran, Protonix, IV fluids for the management Reevaluation of the patient after these medicines and interventions, I reevaluated the patient and found that they have improved I have reviewed the patients home medicines and have made adjustments as needed  Consultations: I requested consultation with the Hospitalist, Dr. Bridgett Larsson and discussed lab and imaging findings as well as pertinent plan - they recommend: We will have the patient evaluated in the emergency department prior to consideration for admission.   Patient case discussed with Suella Broad, PA-C at  sign-out. Plan at sign-out is pending hospitalist evaluation, likely Admission to the hospital, however, plans may change as per oncoming team. Patient care transferred at sign out.   This chart was dictated using voice recognition software, Dragon. Despite the best efforts of this provider to proofread and correct errors, errors may still occur which can change documentation meaning.   Final Clinical Impression(s) / ED Diagnoses Final diagnoses:  Hematemesis with nausea    Rx / DC Orders ED Discharge Orders     None         Kolbey Teichert A, PA-C 05/02/22 [id number]    Veryl Speak, MD 05/06/22 0522

## 2022-05-02 NOTE — ED Triage Notes (Signed)
Ems reports pt from home with husband. Pt started vomiting dark red blood on Friday morning. Pt reports that today her vomit has become darker in color. Pt unable to take   daily medications starting friday. Last BM was today and pt reports that it was brown.

## 2022-05-02 NOTE — Consult Note (Signed)
Referring Provider: [facility] Primary Care Physician:  Daine Floras, MD Primary Gastroenterologist:  Althia Forts  Reason for Consultation:  coffee ground emesis  HPI: Darlene Lloyd is a 88 y.o. female with medical history significant of COPD, hypothyroidism, DM2,  HTN, BPPV.  She presented to [facility] long ED for evaluation of nausea and vomiting.  She reports her vomiting began Saturday morning at 3 AM.  On Friday night she was feeling weak and tired and decided to take a large amount of assorted of vitamins including a multivitamin.  She had 8 different vitamins and minerals in her bathroom and decided to take 2 of each pill and go to bed.  She had multiple episodes of vomiting on Saturday.  Notes her emesis was bloody.  Sunday she only had 10 episodes of vomiting but noticed that her emesis was brown, coffee-ground like emesis.  Since her symptoms were not improving she decided to present to the ED.  In the ED WBC 11.1, Hgb 14.1, BUN 25, lipase 27, CT abdomen pelvis without acute findings.   She denies weight loss, melena, hematochezia. Denies prior episodes of GI bleed, hematemesis, hematochezia. Denies NSAID use. Denies smoking, has rare alcohol use. She is not on any blood thinning medications Last colonoscopy over 10 years ago at the [facility] clinic, states she is unable to have another one, suspect due to diverticular disease severity, no previous EGD.   Past Medical History:  Diagnosis Date   ANA positive    Aortic valve stenosis    Asthma    BPPV (benign paroxysmal positional vertigo)    Cancer (HCC)    breast   Cataract extraction status of eye, right    Cervicalgia    Claw toe    COPD (chronic obstructive pulmonary disease) ([place])    Cystitis    Depression    Diverticulosis    DM (diabetes mellitus) ([place])    Fibromyalgia    Former smoker    Frequent falls    GERD (gastroesophageal reflux disease)    Hammer toe    Hearing loss    Hemangioma    Hernia, diaphragmatic     Hyperopia of both eyes with astigmatism and presbyopia    Hypertension    Hypothyroidism    IBS (irritable bowel syndrome)    Keratosis, inflamed seborrheic    Melanoma in situ of left upper arm (HCC)    Mitral valve regurgitation    MRSA carrier    Neuropathy    Nuclear sclerosis of right eye    Obesity    Osteoarthritis of both hips    Osteoarthritis of right shoulder    Osteoporosis    Phoria    PVD (posterior vitreous detachment), both eyes    Senile osteoporosis    Spinal stenosis of lumbar region with neurogenic claudication    Thoracic and lumbosacral neuritis    Urine incontinence    Vitamin D deficiency     Past Surgical History:  Procedure Laterality Date   [place]   BREAST LUMPECTOMY  1997   CATARACT EXTRACTION  2012   ESOPHAGUS SURGERY  1989   hiatal  1989   laminectomy  1983   TOTAL HIP ARTHROPLASTY  2019   TOTAL KNEE ARTHROPLASTY  2001   TOTAL KNEE ARTHROPLASTY  2003    Prior to Admission medications   Medication Sig Start Date End Date Taking? Authorizing Provider  albuterol (VENTOLIN HFA) 108 (90 Base) MCG/ACT inhaler Inhale into the  lungs every 6 (six) hours as needed for wheezing or shortness of breath.    [provider]  ALPRAZolam Duanne Moron) 0.25 MG tablet Take 0.25 mg by mouth at bedtime as needed for anxiety.    [provider]  AMLODIPINE BESYLATE PO Take 10 mg by mouth.    [provider]  ascorbic acid (VITAMIN C) 500 MG tablet Take 500 mg by mouth daily.    [provider]  bisacodyl (BISACODYL) 5 MG EC tablet Take 10 mg by mouth daily as needed for moderate constipation.    [provider]  Cholecalciferol (VITAMIN D3) 25 MCG (1000 UT) CAPS Take by mouth.    [provider]  dicyclomine (BENTYL) 20 MG tablet Take 20 mg by mouth every 6 (six) hours.    [provider]  esomeprazole (NEXIUM) 20 MG capsule Take 20 mg by mouth daily at 12 noon.     [provider]  fluticasone (FLONASE) 50 MCG/ACT nasal spray Place into both nostrils daily.    [provider]  Fluticasone-Salmeterol (ADVAIR) 100-50 MCG/DOSE AEPB Inhale 1 puff into the lungs 2 (two) times daily.    [provider]  folic acid (FOLVITE) 267 MCG tablet Take 400 mcg by mouth daily.    [provider]  HYOSCYAMINE SULFATE PO Take 0.125 mg by mouth.    [provider]  lamoTRIgine (LAMICTAL) 25 MG tablet Take 25 mg by mouth daily.    [provider]  levothyroxine (SYNTHROID) 150 MCG tablet Take 150 mcg by mouth daily before breakfast.    [provider]  Loperamide HCl (IMODIUM A-D PO) Take by mouth.    [provider]  losartan (COZAAR) 25 MG tablet Take 25 mg by mouth daily.    [provider]  Magnesium 500 MG CAPS Take by mouth.    [provider]  MULTIPLE VITAMIN PO Take by mouth.    [provider]  naproxen (NAPROSYN) 500 MG tablet Take 500 mg by mouth 2 (two) times daily with a meal.    [provider]  Niacin (VITAMIN B-3 PO) Take 25 mcg by mouth.    [provider]  potassium chloride (KLOR-CON) 10 MEQ tablet Take 10 mEq by mouth once.    [provider]  pyridOXINE (VITAMIN B-6) 100 MG tablet Take 100 mg by mouth daily.    [provider]  sertraline (ZOLOFT) 100 MG tablet Take 100 mg by mouth daily.    [provider]    Scheduled Meds:  insulin aspart  0-15 Units Subcutaneous TID WC   insulin aspart  0-5 Units Subcutaneous QHS   pantoprazole (PROTONIX) IV  40 mg Intravenous Q12H   Continuous Infusions:  sodium chloride     PRN Meds:.metoprolol tartrate, prochlorperazine  Allergies as of 05/02/2022 - Review Complete 05/02/2022  Allergen Reaction Noted   Codeine  04/13/2020   Sulfasalazine  04/13/2020    Family History  Problem Relation Age of Onset   Hypertension Father     Social History   Socioeconomic  History   Marital status: Married    Spouse name: Not on file   Number of children: Not on file   Years of education: Not on file   Highest education level: Not on file  Occupational History   Not on file  Tobacco Use   Smoking status: Former   Smokeless tobacco: Never  Substance and Sexual Activity   Alcohol use: Yes   Drug use: Never  Sexual activity: Not on file  Other Topics Concern   Not on file  Social History Narrative   Not on file   Social Determinants of Health   Financial Resource Strain: Not on file  Food Insecurity: Not on file  Transportation Needs: Not on file  Physical Activity: Not on file  Stress: Not on file  Social Connections: Not on file  Intimate Partner Violence: Not on file    Review of Systems: All negative except as stated above in HPI.  Physical Exam:Physical Exam Constitutional:      General: She is not in acute distress.    Appearance: Normal appearance. She is obese.  HENT:     Head: Normocephalic and atraumatic.     Right Ear: External ear normal.     Left Ear: External ear normal.     Nose: Nose normal.     Mouth/Throat:     Mouth: Mucous membranes are moist.  Eyes:     Pupils: Pupils are equal, round, and reactive to light.  Cardiovascular:     Rate and Rhythm: Regular rhythm. Tachycardia present.     Pulses: Normal pulses.     Heart sounds: Normal heart sounds.  Pulmonary:     Effort: Pulmonary effort is normal.     Breath sounds: Normal breath sounds.  Abdominal:     General: Abdomen is flat. Bowel sounds are normal. There is no distension.     Palpations: Abdomen is soft. There is no mass.     Tenderness: There is abdominal tenderness (mild generalized abdominal pain). There is no guarding or rebound.     Hernia: No hernia is present.  Musculoskeletal:        General: No swelling. Normal range of motion.     Cervical back: Normal range of motion and neck supple.  Skin:    General: Skin is warm and dry.      Coloration: Skin is not pale.  Neurological:     General: No focal deficit present.     Mental Status: She is alert and oriented to person, place, and time. Mental status is at baseline.  Psychiatric:        Mood and Affect: Mood normal.        Behavior: Behavior normal.     Vital signs: Vitals:   05/02/22 0700 05/02/22 0703  BP: (!) 161/92   Pulse: 99   Resp: (!) 21   Temp:  97.6 F (36.4 C)  SpO2: 98%         GI:  Lab Results: Recent Labs    05/02/22 0049  WBC 11.1*  HGB 14.1  HCT 45.0  PLT 231   BMET Recent Labs    05/02/22 0049  NA 139  K 3.7  CL 107  CO2 22  GLUCOSE 177*  BUN 25*  CREATININE 0.77  CALCIUM 9.2   LFT Recent Labs    05/02/22 0049  PROT 6.4*  ALBUMIN 3.6  AST 30  ALT 15  ALKPHOS 66  BILITOT 1.2   PT/INR Recent Labs    05/02/22 0435  LABPROT 14.1  INR 1.1     Studies/Results: CT ABDOMEN PELVIS W CONTRAST  Result Date: 05/02/2022 CLINICAL DATA:  Vomiting blood.  Abdominal pain. EXAM: CT ABDOMEN AND PELVIS WITH CONTRAST TECHNIQUE: Multidetector CT imaging of the abdomen and pelvis was performed using the standard protocol following bolus administration of intravenous contrast. RADIATION DOSE REDUCTION: This exam was performed according to the departmental dose-optimization program which  includes automated exposure control, adjustment of the mA and/or kV according to patient size and/or use of iterative reconstruction technique. CONTRAST:  172m OMNIPAQUE IOHEXOL 300 MG/ML  SOLN COMPARISON:  08/26/2020. FINDINGS: Lower chest: A architectural distortion and scarring noted both lung bases, similar to prior. No pleural effusion. Small hiatal hernia noted. Hepatobiliary: No suspicious focal abnormality within the liver parenchyma. Scattered calcified granulomata are evident within the liver parenchyma. Several 7-8 mm calcified gallstones noted without gallbladder wall thickening or pericholecystic fluid. No intrahepatic or extrahepatic  biliary dilation. Pancreas: No focal mass lesion. No dilatation of the main duct. No intraparenchymal cyst. No peripancreatic edema. Spleen: Calcified granulomata noted within the spleen. Adrenals/Urinary Tract: No adrenal nodule or mass. Cortical scarring noted both kidneys stable 14 mm low-density lesion lower pole right kidney compatible with a cyst. 8 mm exophytic lesion anterior lower pole left kidney (delay image 20/series 7) has attenuation higher than would be expected for a simple cyst. No evidence for hydroureter. Bladder partially obscured by beam hardening artifact from right hip replacement with right-sided bladder diverticuli evident. Stomach/Bowel: Small hiatal hernia with surgical changes in the region of the esophagogastric junction. Duodenum is normally positioned as is the ligament of Treitz. No small bowel wall thickening. No small bowel dilatation. The appendix is not well visualized, but there is no edema or inflammation in the region of the cecum. Diverticuli are seen scattered along the entire length of the colon without CT findings of diverticulitis. Extensive diverticular disease noted in the sigmoid colon possible tethering of the vaginal cuff to the sigmoid colonic segment. Vascular/Lymphatic: There is moderate atherosclerotic calcification of the abdominal aorta without aneurysm. There is no gastrohepatic or hepatoduodenal ligament lymphadenopathy. No retroperitoneal or mesenteric lymphadenopathy. No pelvic sidewall lymphadenopathy. Reproductive: Uterus surgically absent. Trace gas is identified in the vagina. There is no adnexal mass. Other: No intraperitoneal free fluid. Musculoskeletal: Left paraumbilical ventral hernia contains only fat with hernia sac not substantially changed measuring on the order of 5.0 x 6.1 x 3.4 cm. No fluid or edema in the hernia sac to suggest fat incarceration. Status post right total hip replacement. Advanced degenerative changes noted left hip. No  worrisome lytic or sclerotic osseous abnormality. Stable L5 compression deformity. IMPRESSION: 1. No acute findings in the abdomen or pelvis. 2. Extensive diverticular disease in the colon, most advanced in the sigmoid segment. Possible tethering of the vaginal cuff to the sigmoid colonic segment with a trace amount a gas noted in the vaginal vault. Correlation for signs/symptoms of colovaginal fistula suggested. 3. Small hiatal hernia with surgical changes in the region of the esophagogastric junction. 4. Cholelithiasis. 5. 8 mm exophytic lesion anterior lower pole left kidney has attenuation higher than would be expected for a simple cyst. This may be a cyst complicated by proteinaceous debris or hemorrhage. Neoplasm not excluded. Follow-up could be used to ensure stability as clinically warranted. 6. Stable paraumbilical ventral hernia containing only fat. No complicating features. 7. Stable L5 compression deformity. 8. Aortic Atherosclerosis (ICD10-I70.0). Electronically Signed   By: EMisty StanleyM.D.   On: 05/02/2022 05:54    Impression: Hematemesis Coffee-ground emesis  Patient with 3 days of nausea and vomiting with hematemesis and coffee-ground emesis.  BUN elevated at 25, hemoglobin 14.1 when checked in the emergency room, no repeat hemoglobin reported at this time.  CT abdomen pelvis without acute findings.  Possible hematemesis due to bleeding ulcer, gastritis, AVM, esophagitis.    Plan: Plan for EGD tomorrow. I thoroughly discussed the  procedures to include nature, alternatives, benefits, and risks including but not limited to bleeding, perforation, infection, anesthesia/cardiac and pulmonary complications. Patient provides understanding and gave verbal consent to proceed. Continue Protonix IV '40mg'$  BID Continue clear liquid diet NPO at midnight Continue anti-emetics and supportive care as needed. [facility] will follow.    LOS: 0 days   Charlott Rakes  PA-C 05/02/2022, 8:28  AM  Contact #  [phone]

## 2022-05-02 NOTE — ED Notes (Signed)
Resting quietly. Skin w/d/I.  No complaints. No signs of distress. Hard of hearing due to spouse taking hearing aids home but answering all questions and following all commands. Calm. No active vomiting. No complaints of abdominal pain.

## 2022-05-02 NOTE — H&P (Signed)
History and Physical    Patient: Darlene Lloyd VZD:[id number] DOB: Oct 21, 1932 DOA: 05/02/2022 DOS: the patient was seen and examined on 05/02/2022 PCP: Daine Floras, MD  Patient coming from: Home  Chief Complaint:  Chief Complaint  Patient presents with   Hematemesis   HPI: Darlene Lloyd is a 88 y.o. female with medical history significant of COPD, hypothyroidism, DM2,  HTN, BPPV. Presenting with N/V. She reports that 3 days ago she lined up 8 bottles of vitamins and took 2 of each. The next morning, she felt nauseous and began vomiting. Initially it was red, but then changed to brown and then brown w/ mucous. It had a coffee ground appearance. Per EDP report she has had similar symptoms in the past. She didn't have any dyspnea, CP, palpitations, lightheadedness or dizziness. She didn't have any frank blood in her stool or noticeable dark stools. When her symptoms did not improve last night, she decided to come to the ED for evaluation.   Review of Systems: As mentioned in the history of present illness. All other systems reviewed and are negative. Past Medical History:  Diagnosis Date   ANA positive    Aortic valve stenosis    Asthma    BPPV (benign paroxysmal positional vertigo)    Cancer (HCC)    breast   Cataract extraction status of eye, right    Cervicalgia    Claw toe    COPD (chronic obstructive pulmonary disease) ([place])    Cystitis    Depression    Diverticulosis    DM (diabetes mellitus) ([place])    Fibromyalgia    Former smoker    Frequent falls    GERD (gastroesophageal reflux disease)    Hammer toe    Hearing loss    Hemangioma    Hernia, diaphragmatic    Hyperopia of both eyes with astigmatism and presbyopia    Hypertension    Hypothyroidism    IBS (irritable bowel syndrome)    Keratosis, inflamed seborrheic    Melanoma in situ of left upper arm (HCC)    Mitral valve regurgitation    MRSA carrier    Neuropathy    Nuclear sclerosis of right eye    Obesity     Osteoarthritis of both hips    Osteoarthritis of right shoulder    Osteoporosis    Phoria    PVD (posterior vitreous detachment), both eyes    Senile osteoporosis    Spinal stenosis of lumbar region with neurogenic claudication    Thoracic and lumbosacral neuritis    Urine incontinence    Vitamin D deficiency    Past Surgical History:  Procedure Laterality Date   [place]   BREAST LUMPECTOMY  1997   CATARACT EXTRACTION  2012   ESOPHAGUS SURGERY  1989   hiatal  1989   laminectomy  1983   TOTAL HIP ARTHROPLASTY  2019   TOTAL KNEE ARTHROPLASTY  2001   TOTAL KNEE ARTHROPLASTY  2003   Social History:  reports that she has quit smoking. She has never used smokeless tobacco. She reports current alcohol use. She reports that she does not use drugs.  Allergies  Allergen Reactions   Codeine    Sulfasalazine     Family History  Problem Relation Age of Onset   Hypertension Father     Prior to Admission medications   Medication Sig Start Date End Date Taking? Authorizing Provider  albuterol (VENTOLIN HFA) 108 (90 Base) MCG/ACT  inhaler Inhale into the lungs every 6 (six) hours as needed for wheezing or shortness of breath.    [provider]  ALPRAZolam Duanne Moron) 0.25 MG tablet Take 0.25 mg by mouth at bedtime as needed for anxiety.    [provider]  AMLODIPINE BESYLATE PO Take 10 mg by mouth.    [provider]  ascorbic acid (VITAMIN C) 500 MG tablet Take 500 mg by mouth daily.    [provider]  bisacodyl (BISACODYL) 5 MG EC tablet Take 10 mg by mouth daily as needed for moderate constipation.    [provider]  Cholecalciferol (VITAMIN D3) 25 MCG (1000 UT) CAPS Take by mouth.    [provider]  dicyclomine (BENTYL) 20 MG tablet Take 20 mg by mouth every 6 (six) hours.    [provider]  esomeprazole (NEXIUM) 20 MG capsule Take 20 mg by mouth daily at 12 noon.    [provider]  fluticasone (FLONASE) 50 MCG/ACT nasal spray Place into both nostrils daily.    [provider]  Fluticasone-Salmeterol (ADVAIR) 100-50 MCG/DOSE AEPB Inhale 1 puff into the lungs 2 (two) times daily.    [provider]  folic acid (FOLVITE) 417 MCG tablet Take 400 mcg by mouth daily.    [provider]  HYOSCYAMINE SULFATE PO Take 0.125 mg by mouth.    [provider]  lamoTRIgine (LAMICTAL) 25 MG tablet Take 25 mg by mouth daily.    [provider]  levothyroxine (SYNTHROID) 150 MCG tablet Take 150 mcg by mouth daily before breakfast.    [provider]  Loperamide HCl (IMODIUM A-D PO) Take by mouth.    [provider]  losartan (COZAAR) 25 MG tablet Take 25 mg by mouth daily.    [provider]  Magnesium 500 MG CAPS Take by mouth.    [provider]  MULTIPLE VITAMIN PO Take by mouth.    [provider]  naproxen (NAPROSYN) 500 MG tablet Take 500 mg by mouth 2 (two) times daily with a meal.    [provider]  Niacin (VITAMIN B-3 PO) Take 25 mcg by mouth.    [provider]  potassium chloride (KLOR-CON) 10 MEQ tablet Take 10 mEq by mouth once.    [provider]  pyridOXINE (VITAMIN B-6) 100 MG tablet Take 100 mg by mouth daily.    [provider]  sertraline (ZOLOFT) 100 MG tablet Take 100 mg by mouth daily.    [provider]    Physical Exam: Vitals:   05/02/22 0430 05/02/22 0500 05/02/22 0700 05/02/22 0703  BP: (!) 158/88 (!) 164/86 (!) 161/92   Pulse: (!) 103 99 99   Resp: 16 15 (!) 21   Temp:    97.6 F (36.4 C)  TempSrc:    Oral  SpO2: 97% 99% 98%   Weight:      Height:       General: 88 y.o. female resting in bed in NAD Eyes: PERRL, normal sclera ENMT: Nares patent w/o discharge, orophaynx clear, dentition normal, ears w/o discharge/lesions/ulcers Neck: Supple, trachea midline Cardiovascular: RRR, +S1, S2, no m/g/r, equal  pulses throughout Respiratory: CTABL, no w/r/r, normal WOB GI: BS+, NDNT, no masses noted, no organomegaly noted MSK: No e/c/c Neuro: A&O x 3, no focal deficits Psyc: Appropriate interaction and affect, calm/cooperative  Data Reviewed:  Results for orders placed or performed during the hospital encounter of 05/02/22 (from the past 24 hour(s))  CBC with Differential     Status: Abnormal   Collection Time: 05/02/22 12:49 AM  Result Value Ref Range   WBC 11.1 (H) 4.0 - 10.5 K/uL   RBC 5.02 3.87 - 5.11 MIL/uL   Hemoglobin 14.1 12.0 - 15.0 g/dL   HCT 45.0 36.0 - 46.0 %   MCV 89.6 80.0 - 100.0 fL   MCH 28.1 26.0 - 34.0 pg   MCHC 31.3 30.0 - 36.0 g/dL   RDW 15.1 11.5 - 15.5 %   Platelets 231 150 - 400 K/uL   nRBC 0.0 0.0 - 0.2 %   Neutrophils Relative % 84 %   Neutro Abs 9.2 (H) 1.7 - 7.7 K/uL   Lymphocytes Relative 11 %   Lymphs Abs 1.2 0.7 - 4.0 K/uL   Monocytes Relative 4 %   Monocytes Absolute 0.5 0.1 - 1.0 K/uL   Eosinophils Relative 0 %   Eosinophils Absolute 0.0 0.0 - 0.5 K/uL   Basophils Relative 0 %   Basophils Absolute 0.0 0.0 - 0.1 K/uL   Immature Granulocytes 1 %   Abs Immature Granulocytes 0.07 0.00 - 0.07 K/uL  Comprehensive metabolic panel     Status: Abnormal   Collection Time: 05/02/22 12:49 AM  Result Value Ref Range   Sodium 139 135 - 145 mmol/L   Potassium 3.7 3.5 - 5.1 mmol/L   Chloride 107 98 - 111 mmol/L   CO2 22 22 - 32 mmol/L   Glucose, Bld 177 (H) 70 - 99 mg/dL   BUN 25 (H) 8 - 23 mg/dL   Creatinine, Ser 0.77 0.44 - 1.00 mg/dL   Calcium 9.2 8.9 - 10.3 mg/dL   Total Protein 6.4 (L) 6.5 - 8.1 g/dL   Albumin 3.6 3.5 - 5.0 g/dL   AST 30 15 - 41 U/L   ALT 15 0 - 44 U/L   Alkaline Phosphatase 66 38 - 126 U/L   Total Bilirubin 1.2 0.3 - 1.2 mg/dL   GFR, Estimated >60 >60 mL/min   Anion gap 10 5 - 15  Lipase, blood     Status: None   Collection Time: 05/02/22 12:49 AM  Result Value Ref Range   Lipase 27 11 - 51 U/L  Type and screen [place]     Status: None (Preliminary result)   Collection Time: 05/02/22  4:35 AM  Result Value Ref Range   ABO/RH(D) A POS    Antibody Screen POS    Sample Expiration      05/05/2022,2359 Performed at [facility], [place] [address], [place], [place] [address]   Protime-INR     Status: None   Collection Time: 05/02/22  4:35 AM  Result Value Ref Range   Prothrombin Time 14.1 11.4 - 15.2 seconds   INR 1.1 0.8 - 1.2  POC occult blood, ED     Status: None   Collection Time: 05/02/22  6:47 AM  Result Value Ref Range   Fecal Occult Bld NEGATIVE NEGATIVE   CT ab/pelvis 1. No acute findings in the abdomen or pelvis. 2. Extensive diverticular disease in the colon, most advanced in the sigmoid segment. Possible tethering of the vaginal cuff to the sigmoid colonic segment with a trace amount a gas noted in the vaginal vault. Correlation for signs/symptoms of colovaginal fistula suggested. 3. Small hiatal hernia with surgical changes in the region of the esophagogastric junction. 4. Cholelithiasis. 5. 8 mm exophytic lesion anterior lower pole left kidney has attenuation higher than  would be expected for a simple cyst. This may be a cyst complicated by proteinaceous debris or hemorrhage. Neoplasm not excluded. Follow-up could be used to ensure stability as clinically warranted. 6. Stable paraumbilical ventral hernia containing only fat. No complicating features. 7. Stable L5 compression deformity. 8. Aortic Atherosclerosis (ICD10-I70.0).  EKG: sinus tach, no st elevations  Assessment and Plan: N/V Hematemesis?     - place in obs, tele     - trend H&H, transfuse for Hgb < 7     - NPO until seen by GI; [facility] onboard, appreciate assistance     - fluids, protonix, compazine  COPD     - continue home regimen when confirmed  HTN     - PRN metoprolol until off NPO status  DM2     - check A1c     - SSI, glucose checks     - NPO for now    Anxiety     - continue home regimen when confirmed and off NPO status  Hypothyroidism     - continue home regimen when off NPO status  Advance Care Planning:   Code Status: FULL  Consults: [facility]  Family Communication: None at bedside  Severity of Illness: The appropriate patient status for this patient is OBSERVATION. Observation status is judged to be reasonable and necessary in order to provide the required intensity of service to ensure the patient's safety. The patient's presenting symptoms, physical exam findings, and initial radiographic and laboratory data in the context of their medical condition is felt to place them at decreased risk for further clinical deterioration. Furthermore, it is anticipated that the patient will be medically stable for discharge from the hospital within 2 midnights of admission.   Time spent in coordination of this H&P: 72 minutes  Author: Jonnie Finner, DO 05/02/2022 7:40 AM  For on call review [email or web address].

## 2022-05-02 NOTE — ED Notes (Addendum)
Pt oxygen saturation dropped to 80 on room air.  Pt placed on 2l  and oxygen recovered to 100%. Pt reports that she wears oxygen at home at night

## 2022-05-02 NOTE — ED Provider Notes (Signed)
88 year old female presents with coffee-ground emesis, seen by night team and pending admission to the hospital service. Physical Exam  BP (!) 161/92   Pulse 99   Temp 97.6 F (36.4 C) (Oral)   Resp (!) 21   Ht '5\' 2"'$  (1.575 m)   Wt 77.1 kg   SpO2 98%   BMI 31.09 kg/m   Physical Exam  Procedures  Procedures  ED Course / MDM   Clinical Course as of 05/02/22 0801  Mon May 02, 2022  0330 Discussed patient case with attending who agrees with treatment plan. [SB]  0350 Re-evaluated and patient noted improvement of symptoms with treatment regimen in the ED. [SB]  0617 Attending in to evaluate patient [SB]  0628 Consult with hospitalist, Dr. Bridgett Larsson who will have a patient evaluated by another specialist in the ED.  [SB]    Clinical Course User Index [SB] Blue, Soijett A, PA-C   Medical Decision Making Amount and/or Complexity of Data Reviewed Labs: ordered. Radiology: ordered.  Risk Prescription drug management. Decision regarding hospitalization.   Case discussed with Dr. Marylyn Ishihara with [facility], and asking for further information on this patient who is Hemoccult negative and hemodynamically stable.  I have seen the patient, she has what appears to be coffee-ground emesis on her bedding, night short and on a tissue in her emesis bag.  She notes that she had bright red blood Friday and Saturday when she vomited and today now has coffee-ground emesis.  Dr. Marylyn Ishihara agrees to consult for the admission, requests a consult to GI to assist with patient's care today.  Discussed with Dr. Therisa Doyne with [facility] who will see the patient.  Patient reports she moved here from [place], does not have a local GI.       Tacy Learn, PA-C 05/02/22 0801    Godfrey Pick, MD 05/02/22 [phone]

## 2022-05-03 ENCOUNTER — Encounter (HOSPITAL_COMMUNITY)
Admission: EM | Disposition: A | Discharge status: Home / Self Care | Payer: Self-pay | Source: Home / Self Care | Attending: Internal Medicine

## 2022-05-03 DIAGNOSIS — Q398 Other congenital malformations of esophagus: Secondary | ICD-10-CM | POA: Diagnosis not present

## 2022-05-03 DIAGNOSIS — K2100 Gastro-esophageal reflux disease with esophagitis, without bleeding: Secondary | ICD-10-CM | POA: Diagnosis present

## 2022-05-03 DIAGNOSIS — Z96649 Presence of unspecified artificial hip joint: Secondary | ICD-10-CM | POA: Diagnosis present

## 2022-05-03 DIAGNOSIS — Z87891 Personal history of nicotine dependence: Secondary | ICD-10-CM | POA: Diagnosis not present

## 2022-05-03 DIAGNOSIS — J4489 Other specified chronic obstructive pulmonary disease: Secondary | ICD-10-CM | POA: Diagnosis present

## 2022-05-03 DIAGNOSIS — K3189 Other diseases of stomach and duodenum: Secondary | ICD-10-CM | POA: Diagnosis present

## 2022-05-03 DIAGNOSIS — Z1152 Encounter for screening for COVID-19: Secondary | ICD-10-CM | POA: Diagnosis not present

## 2022-05-03 DIAGNOSIS — Z7951 Long term (current) use of inhaled steroids: Secondary | ICD-10-CM | POA: Diagnosis not present

## 2022-05-03 DIAGNOSIS — M797 Fibromyalgia: Secondary | ICD-10-CM | POA: Diagnosis present

## 2022-05-03 DIAGNOSIS — E119 Type 2 diabetes mellitus without complications: Secondary | ICD-10-CM | POA: Diagnosis present

## 2022-05-03 DIAGNOSIS — Z79899 Other long term (current) drug therapy: Secondary | ICD-10-CM | POA: Diagnosis not present

## 2022-05-03 DIAGNOSIS — I10 Essential (primary) hypertension: Secondary | ICD-10-CM | POA: Diagnosis present

## 2022-05-03 DIAGNOSIS — K920 Hematemesis: Secondary | ICD-10-CM | POA: Diagnosis present

## 2022-05-03 DIAGNOSIS — J449 Chronic obstructive pulmonary disease, unspecified: Secondary | ICD-10-CM

## 2022-05-03 DIAGNOSIS — I951 Orthostatic hypotension: Secondary | ICD-10-CM | POA: Diagnosis not present

## 2022-05-03 DIAGNOSIS — H8110 Benign paroxysmal vertigo, unspecified ear: Secondary | ICD-10-CM | POA: Diagnosis present

## 2022-05-03 DIAGNOSIS — F419 Anxiety disorder, unspecified: Secondary | ICD-10-CM

## 2022-05-03 DIAGNOSIS — Z8249 Family history of ischemic heart disease and other diseases of the circulatory system: Secondary | ICD-10-CM | POA: Diagnosis not present

## 2022-05-03 DIAGNOSIS — H9190 Unspecified hearing loss, unspecified ear: Secondary | ICD-10-CM | POA: Diagnosis present

## 2022-05-03 DIAGNOSIS — M19011 Primary osteoarthritis, right shoulder: Secondary | ICD-10-CM | POA: Diagnosis present

## 2022-05-03 DIAGNOSIS — E876 Hypokalemia: Secondary | ICD-10-CM | POA: Diagnosis not present

## 2022-05-03 DIAGNOSIS — R110 Nausea: Secondary | ICD-10-CM | POA: Diagnosis not present

## 2022-05-03 DIAGNOSIS — E039 Hypothyroidism, unspecified: Secondary | ICD-10-CM

## 2022-05-03 DIAGNOSIS — Z86006 Personal history of melanoma in-situ: Secondary | ICD-10-CM | POA: Diagnosis not present

## 2022-05-03 DIAGNOSIS — M160 Bilateral primary osteoarthritis of hip: Secondary | ICD-10-CM | POA: Diagnosis present

## 2022-05-03 DIAGNOSIS — Z79890 Hormone replacement therapy (postmenopausal): Secondary | ICD-10-CM | POA: Diagnosis not present

## 2022-05-03 LAB — CBC
HCT: 41.2 % (ref 36.0–46.0)
Hemoglobin: 13.1 g/dL (ref 12.0–15.0)
MCH: 27.5 pg (ref 26.0–34.0)
MCHC: 31.8 g/dL (ref 30.0–36.0)
MCV: 86.4 fL (ref 80.0–100.0)
Platelets: 226 10*3/uL (ref 150–400)
RBC: 4.77 MIL/uL (ref 3.87–5.11)
RDW: 15 % (ref 11.5–15.5)
WBC: 10.2 10*3/uL (ref 4.0–10.5)
nRBC: 0 % (ref 0.0–0.2)

## 2022-05-03 LAB — GLUCOSE, CAPILLARY
Glucose-Capillary: 82 mg/dL (ref 70–99)
Glucose-Capillary: 93 mg/dL (ref 70–99)
Glucose-Capillary: 85 mg/dL (ref 70–99)
Glucose-Capillary: 83 mg/dL (ref 70–99)

## 2022-05-03 LAB — COMPREHENSIVE METABOLIC PANEL
ALT: 13 U/L (ref 0–44)
AST: 17 U/L (ref 15–41)
Albumin: 3.5 g/dL (ref 3.5–5.0)
Alkaline Phosphatase: 62 U/L (ref 38–126)
Anion gap: 8 (ref 5–15)
BUN: 17 mg/dL (ref 8–23)
CO2: 26 mmol/L (ref 22–32)
Calcium: 9.1 mg/dL (ref 8.9–10.3)
Chloride: 107 mmol/L (ref 98–111)
Creatinine, Ser: 0.8 mg/dL (ref 0.44–1.00)
GFR, Estimated: >60 mL/min (ref >60)
Glucose, Bld: 90 mg/dL (ref 70–99)
Potassium: 2.5 mmol/L — CL (ref 3.5–5.1)
Sodium: 141 mmol/L (ref 135–145)
Total Bilirubin: 0.8 mg/dL (ref 0.3–1.2)
Total Protein: 6.2 g/dL — ABNORMAL LOW (ref 6.5–8.1)

## 2022-05-03 LAB — ABO/RH: ABO/RH(D): A POS

## 2022-05-03 LAB — MAGNESIUM: Magnesium: 1.8 mg/dL (ref 1.7–2.4)

## 2022-05-03 LAB — PROTIME-INR
INR: 1 (ref 0.8–1.2)
Prothrombin Time: 13.3 seconds (ref 11.4–15.2)

## 2022-05-03 SURGERY — EGD (ESOPHAGOGASTRODUODENOSCOPY)
Anesthesia: Monitor Anesthesia Care

## 2022-05-03 MED ORDER — SODIUM CHLORIDE 0.9 % IV BOLUS
500.0000 mL | Freq: Once | INTRAVENOUS | Status: AC
  Administered 2022-05-03: 500 mL via INTRAVENOUS

## 2022-05-03 MED ORDER — ESCITALOPRAM OXALATE 20 MG PO TABS
20.0000 mg | ORAL_TABLET | Freq: Every day | ORAL | Status: DC
  Administered 2022-05-03: 20 mg via ORAL
  Filled 2022-05-03: qty 1

## 2022-05-03 MED ORDER — SUCRALFATE 1 GM/10ML PO SUSP
1.0000 g | Freq: Three times a day (TID) | ORAL | Status: DC
  Administered 2022-05-03 – 2022-05-04 (×4): 1 g via ORAL
  Filled 2022-05-03 (×4): qty 10

## 2022-05-03 MED ORDER — POTASSIUM CHLORIDE 10 MEQ/100ML IV SOLN
10.0000 meq | INTRAVENOUS | Status: AC
  Administered 2022-05-03 (×6): 10 meq via INTRAVENOUS
  Filled 2022-05-03 (×2): qty 100

## 2022-05-03 MED ORDER — ACETAMINOPHEN 325 MG PO TABS
650.0000 mg | ORAL_TABLET | ORAL | Status: DC | PRN
  Administered 2022-05-03: 650 mg via ORAL
  Filled 2022-05-03: qty 2

## 2022-05-03 MED ORDER — TRAMADOL HCL 50 MG PO TABS
50.0000 mg | ORAL_TABLET | Freq: Once | ORAL | Status: AC
  Administered 2022-05-03: 50 mg via ORAL
  Filled 2022-05-03: qty 1

## 2022-05-03 MED ORDER — ALBUTEROL SULFATE (2.5 MG/3ML) 0.083% IN NEBU: 3.0000 mL | INHALATION_SOLUTION | RESPIRATORY_TRACT | Status: DC | PRN

## 2022-05-03 MED ORDER — DICYCLOMINE HCL 20 MG PO TABS: 20.0000 mg | ORAL_TABLET | Freq: Three times a day (TID) | ORAL | Status: DC | PRN

## 2022-05-03 MED ORDER — METOPROLOL TARTRATE 5 MG/5ML IV SOLN
2.5000 mg | Freq: Three times a day (TID) | INTRAVENOUS | Status: DC
  Administered 2022-05-03 (×2): 2.5 mg via INTRAVENOUS
  Filled 2022-05-03 (×2): qty 5

## 2022-05-03 MED ORDER — UMECLIDINIUM BROMIDE 62.5 MCG/ACT IN AEPB
1.0000 | INHALATION_SPRAY | Freq: Every day | RESPIRATORY_TRACT | Status: DC
  Administered 2022-05-03 – 2022-05-04 (×2): 1 via RESPIRATORY_TRACT
  Filled 2022-05-03: qty 7

## 2022-05-03 MED ORDER — FLUTICASONE PROPIONATE 50 MCG/ACT NA SUSP: 2.0000 | Freq: Every day | NASAL | Status: DC | PRN

## 2022-05-03 MED ORDER — MELATONIN 5 MG PO TABS
5.0000 mg | ORAL_TABLET | Freq: Once | ORAL | Status: AC
  Administered 2022-05-03: 5 mg via ORAL
  Filled 2022-05-03: qty 1

## 2022-05-03 MED ORDER — MOMETASONE FURO-FORMOTEROL FUM 200-5 MCG/ACT IN AERO
2.0000 | INHALATION_SPRAY | Freq: Two times a day (BID) | RESPIRATORY_TRACT | Status: DC
  Administered 2022-05-03 – 2022-05-04 (×3): 2 via RESPIRATORY_TRACT
  Filled 2022-05-03: qty 8.8

## 2022-05-03 MED ORDER — BUDESON-GLYCOPYRROL-FORMOTEROL 160-9-4.8 MCG/ACT IN AERO: 2.0000 | INHALATION_SPRAY | Freq: Two times a day (BID) | RESPIRATORY_TRACT | Status: DC

## 2022-05-03 MED ORDER — ACETAMINOPHEN 325 MG PO TABS
650.0000 mg | ORAL_TABLET | Freq: Once | ORAL | Status: AC
  Administered 2022-05-03: 650 mg via ORAL
  Filled 2022-05-03: qty 2

## 2022-05-03 MED ORDER — PROCHLORPERAZINE EDISYLATE 10 MG/2ML IJ SOLN
5.0000 mg | Freq: Once | INTRAMUSCULAR | Status: AC
  Administered 2022-05-03: 5 mg via INTRAVENOUS
  Filled 2022-05-03: qty 2

## 2022-05-03 MED ORDER — HYOSCYAMINE SULFATE ER 0.375 MG PO TB12: 0.3750 mg | ORAL_TABLET | Freq: Two times a day (BID) | ORAL | Status: DC | PRN

## 2022-05-03 MED ORDER — BISACODYL 5 MG PO TBEC: 10.0000 mg | DELAYED_RELEASE_TABLET | Freq: Every day | ORAL | Status: DC | PRN

## 2022-05-03 MED ORDER — LEVOTHYROXINE SODIUM 50 MCG PO TABS
150.0000 ug | ORAL_TABLET | Freq: Every day | ORAL | Status: DC
  Administered 2022-05-04: 150 ug via ORAL
  Filled 2022-05-03: qty 1

## 2022-05-03 MED ORDER — ALPRAZOLAM 0.25 MG PO TABS
0.2500 mg | ORAL_TABLET | Freq: Every evening | ORAL | Status: DC | PRN
  Administered 2022-05-03: 0.25 mg via ORAL
  Filled 2022-05-03: qty 1

## 2022-05-03 NOTE — Progress Notes (Signed)
[facility] Progress Note  Darlene Lloyd 88 y.o. 11-Sep-1932    Subjective: Patient seen and examined laying in bed, patient notes she is concerned about upcoming EGD, she does not want to proceed with EGD due to concern for risks.  I discussed risks and benefits of proceeding with the procedure or if canceling the procedure.  Patient understands the risks of missing a possible finding of ulcer, malignancy or bleed if we cancel the EGD.  She would like to proceed with conservative measures.  She has no further abdominal pain or hematemesis since yesterday.   Objective: Vital signs in last 24 hours: Vitals:   05/03/22 0107 05/03/22 0503  BP: (!) 157/83 (!) 156/78  Pulse: 85 81  Resp: 14 14  Temp: 98.1 F (36.7 C) 98 F (36.7 C)  SpO2: 96% 94%    Physical Exam:  General:  Alert, cooperative, no distress, appears stated age  Head:  Normocephalic, without obvious abnormality, atraumatic  Eyes:  Anicteric sclera, EOM's intact  Lungs:   Clear to auscultation bilaterally, respirations unlabored  Heart:  Regular rate and rhythm, S1, S2 normal  Abdomen:   Soft, non-tender, bowel sounds active all four quadrants,  no masses,   Extremities: Extremities normal, atraumatic, no  edema  Pulses: 2+ and symmetric    Lab Results: Recent Labs    05/02/22 0049 05/03/22 0536  NA 139 141  K 3.7 2.5*  CL 107 107  CO2 22 26  GLUCOSE 177* 90  BUN 25* 17  CREATININE 0.77 0.80  CALCIUM 9.2 9.1  MG  --  1.8   Recent Labs    05/02/22 0049 05/03/22 0536  AST 30 17  ALT 15 13  ALKPHOS 66 62  BILITOT 1.2 0.8  PROT 6.4* 6.2*  ALBUMIN 3.6 3.5   Recent Labs    05/02/22 0049 05/03/22 0536  WBC 11.1* 10.2  NEUTROABS 9.2*  --   HGB 14.1 13.1  HCT 45.0 41.2  MCV 89.6 86.4  PLT 231 226   Recent Labs    05/02/22 0435 05/03/22 0536  LABPROT 14.1 13.3  INR 1.1 1.0      Assessment Hematemesis Coffee-ground emesis Hypokalemia  Hemoglobin 13.1(14.1) No further  episodes of hematemesis or coffee-ground emesis since yesterday.  Patient does not want to proceed with EGD for further evaluation.  She would like to proceed with conservative measures.  Hemoglobin stable at this time.  Do not suspect brisk GI bleed at this time.   Plan: Continue pantoprazole 40 mg IV twice daily.  Start Carafate 1 g 3 times daily for 2 weeks. Monitor hemoglobin, transfuse if less than 7. [facility] will follow.  Arvella Nigh Artha Chiasson PA-C 05/03/2022, 9:47 AM  Contact #  [phone]

## 2022-05-03 NOTE — Progress Notes (Signed)
PROGRESS NOTE    Darlene Lloyd  WIO:[id number] DOB: 1932/11/30 DOA: 05/02/2022 PCP: Daine Floras, MD    Chief Complaint  Patient presents with   Hematemesis    Brief Narrative:  Patient 88 year old female history of COPD, hypothyroidism, diabetes, hypertension, BPPV, presenting with nausea vomiting hematemesis.  Patient noted 3 days ago to have taken a bunch of vitamins and the next morning felt nauseous and began vomiting, had some coffee-ground emesis.  Patient noted to have had similar symptoms in the past.  Patient denies any dyspnea, no chest pain, no palpitation, no lightheadedness or dizziness on presentation.  Patient admitted, GI consulted for upper endoscopy.  Upper endoscopy initially scheduled for 05/03/2022 however patient refused and endoscopy canceled.  Patient subsequently later on in the day 05/03/2022 changed her mind and wanted upper endoscopy for further evaluation.  Patient scheduled for upper endoscopy 05/04/2022.   Assessment & Plan:   Principal Problem:   Hematemesis Active Problems:   HTN (hypertension)   COPD (chronic obstructive pulmonary disease) (HCC)   DM2 (diabetes mellitus, type 2) (HCC)   Hypothyroidism   Anxiety  #1 nausea vomiting/hematemesis -Patient admitted with hematemesis, patient with clinical improvement this morning. -Patient initially seen by GI this morning and initially refused EGD.  -Patient subsequently has changed her mind and requesting to have EGD done.   -Hemoglobin currently stable at 13.1.   -Reached out back to GI and patient will be placed on clear liquids with a EGD scheduled for tomorrow.  -Continue IV fluids, IV PPI every 12 hours, patient started on Carafate per GI.   -Serial CBCs.   -Transfusion threshold hemoglobin < 7.  -GI following.  2.  Orthostatic hypotension -With complaints of lightheadedness and dizziness with positional changes today. -Orthostatics checked this morning and noted to be positive. -Normal saline  500 cc bolus x 1 -Normal saline 100 cc an hour. -Repeat orthostatics in the AM.  3.  Hypokalemia -Likely secondary to GI losses. -Magnesium at 1.8. -Patient receiving KCl IV runs. -Repeat labs in the AM.  4.  COPD -Continue Dulera, Incruse. -Nebs as needed.  5.  Hypertension -Was placed on IV Lopressor 2.5 mg every 8 hours, however due to hypotension we will discontinue Lopressor. -Follow.  6.  Hypothyroidism -Continue home regimen Synthroid.  7.  Anxiety -Continue home regimen Xanax as needed.  8. DM2 -Hemoglobin A1c 5.8. -SSI.   DVT prophylaxis: SCDs Code Status: Full Family Communication: Updated patient, husband at bedside. Disposition: Likely home when cleared by GI, with correction of hypokalemia.  Status is: Inpatient The patient will require care spanning > 2 midnights and should be moved to inpatient because: Severity of illness   Consultants:  Gastroenterology: Dr. Therisa Doyne 05/02/2022  Procedures:  CT abdomen and pelvis 05/02/2022   Antimicrobials:  None   Subjective: Patient laying in bed.  Complaining of lightheadedness and dizziness.  Noted to have refused EGD earlier on this morning when seen by GI PA however patient states she has changed her mind and would like EGD done.  Husband at bedside.  Patient denies any further hematemesis.  Some nausea.  Complaining of headache not relieved with Tylenol.  Objective: Vitals:   05/02/22 1658 05/02/22 1927 05/03/22 0107 05/03/22 0503  BP: (!) 150/82 (!) 152/93 (!) 157/83 (!) 156/78  Pulse: 92 90 85 81  Resp: (!) '22 18 14 14  '$ Temp: 98.1 F (36.7 C) 97.9 F (36.6 C) 98.1 F (36.7 C) 98 F (36.7 C)  TempSrc: Oral Oral Oral Oral  SpO2: 95% 98% 96% 94%  Weight:      Height:        Intake/Output Summary (Last 24 hours) at 05/03/2022 1249 Last data filed at 05/03/2022 1100 Gross per 24 hour  Intake --  Output 1100 ml  Net -1100 ml   Filed Weights   05/02/22 0025 05/02/22 1334  Weight: 77.1 kg 77.1  kg    Examination:  General exam: Appears calm and comfortable.  Dry mucous membranes. Respiratory system: Clear to auscultation.  No wheezes, no crackles, no rhonchi.  Respiratory effort normal. Cardiovascular system: S1 & S2 heard, RRR. No JVD, murmurs, rubs, gallops or clicks. No pedal edema. Gastrointestinal system: Abdomen is nondistended, soft and nontender. No organomegaly or masses felt. Normal bowel sounds heard. Central nervous system: Alert and oriented. No focal neurological deficits. Extremities: Symmetric 5 x 5 power. Skin: No rashes, lesions or ulcers Psychiatry: Judgement and insight appear normal. Mood & affect appropriate.     Data Reviewed: I have personally reviewed following labs and imaging studies  CBC: Recent Labs  Lab 05/02/22 0049 05/03/22 0536  WBC 11.1* 10.2  NEUTROABS 9.2*  --   HGB 14.1 13.1  HCT 45.0 41.2  MCV 89.6 86.4  PLT 231 048    Basic Metabolic Panel: Recent Labs  Lab 05/02/22 0049 05/03/22 0536  NA 139 141  K 3.7 2.5*  CL 107 107  CO2 22 26  GLUCOSE 177* 90  BUN 25* 17  CREATININE 0.77 0.80  CALCIUM 9.2 9.1  MG  --  1.8    GFR: Estimated Creatinine Clearance: 47.6 mL/min (by C-G formula based on SCr of 0.8 mg/dL).  Liver Function Tests: Recent Labs  Lab 05/02/22 0049 05/03/22 0536  AST 30 17  ALT 15 13  ALKPHOS 66 62  BILITOT 1.2 0.8  PROT 6.4* 6.2*  ALBUMIN 3.6 3.5    CBG: Recent Labs  Lab 05/02/22 1209 05/02/22 1747 05/02/22 2129 05/03/22 0730 05/03/22 1150  GLUCAP 123* 140* 115* 82 93     No results found for this or any previous visit (from the past 240 hour(s)).       Radiology Studies: CT ABDOMEN PELVIS W CONTRAST  Result Date: 05/02/2022 CLINICAL DATA:  Vomiting blood.  Abdominal pain. EXAM: CT ABDOMEN AND PELVIS WITH CONTRAST TECHNIQUE: Multidetector CT imaging of the abdomen and pelvis was performed using the standard protocol following bolus administration of intravenous contrast.  RADIATION DOSE REDUCTION: This exam was performed according to the departmental dose-optimization program which includes automated exposure control, adjustment of the mA and/or kV according to patient size and/or use of iterative reconstruction technique. CONTRAST:  134m OMNIPAQUE IOHEXOL 300 MG/ML  SOLN COMPARISON:  08/26/2020. FINDINGS: Lower chest: A architectural distortion and scarring noted both lung bases, similar to prior. No pleural effusion. Small hiatal hernia noted. Hepatobiliary: No suspicious focal abnormality within the liver parenchyma. Scattered calcified granulomata are evident within the liver parenchyma. Several 7-8 mm calcified gallstones noted without gallbladder wall thickening or pericholecystic fluid. No intrahepatic or extrahepatic biliary dilation. Pancreas: No focal mass lesion. No dilatation of the main duct. No intraparenchymal cyst. No peripancreatic edema. Spleen: Calcified granulomata noted within the spleen. Adrenals/Urinary Tract: No adrenal nodule or mass. Cortical scarring noted both kidneys stable 14 mm low-density lesion lower pole right kidney compatible with a cyst. 8 mm exophytic lesion anterior lower pole left kidney (delay image 20/series 7) has attenuation higher than would be expected for a simple cyst. No evidence for hydroureter. Bladder  partially obscured by beam hardening artifact from right hip replacement with right-sided bladder diverticuli evident. Stomach/Bowel: Small hiatal hernia with surgical changes in the region of the esophagogastric junction. Duodenum is normally positioned as is the ligament of Treitz. No small bowel wall thickening. No small bowel dilatation. The appendix is not well visualized, but there is no edema or inflammation in the region of the cecum. Diverticuli are seen scattered along the entire length of the colon without CT findings of diverticulitis. Extensive diverticular disease noted in the sigmoid colon possible tethering of the  vaginal cuff to the sigmoid colonic segment. Vascular/Lymphatic: There is moderate atherosclerotic calcification of the abdominal aorta without aneurysm. There is no gastrohepatic or hepatoduodenal ligament lymphadenopathy. No retroperitoneal or mesenteric lymphadenopathy. No pelvic sidewall lymphadenopathy. Reproductive: Uterus surgically absent. Trace gas is identified in the vagina. There is no adnexal mass. Other: No intraperitoneal free fluid. Musculoskeletal: Left paraumbilical ventral hernia contains only fat with hernia sac not substantially changed measuring on the order of 5.0 x 6.1 x 3.4 cm. No fluid or edema in the hernia sac to suggest fat incarceration. Status post right total hip replacement. Advanced degenerative changes noted left hip. No worrisome lytic or sclerotic osseous abnormality. Stable L5 compression deformity. IMPRESSION: 1. No acute findings in the abdomen or pelvis. 2. Extensive diverticular disease in the colon, most advanced in the sigmoid segment. Possible tethering of the vaginal cuff to the sigmoid colonic segment with a trace amount a gas noted in the vaginal vault. Correlation for signs/symptoms of colovaginal fistula suggested. 3. Small hiatal hernia with surgical changes in the region of the esophagogastric junction. 4. Cholelithiasis. 5. 8 mm exophytic lesion anterior lower pole left kidney has attenuation higher than would be expected for a simple cyst. This may be a cyst complicated by proteinaceous debris or hemorrhage. Neoplasm not excluded. Follow-up could be used to ensure stability as clinically warranted. 6. Stable paraumbilical ventral hernia containing only fat. No complicating features. 7. Stable L5 compression deformity. 8. Aortic Atherosclerosis (ICD10-I70.0). Electronically Signed   By: Misty Stanley M.D.   On: 05/02/2022 05:54        Scheduled Meds:  escitalopram  20 mg Oral Daily   insulin aspart  0-15 Units Subcutaneous TID WC   insulin aspart  0-5  Units Subcutaneous QHS   [START ON 05/04/2022] levothyroxine  150 mcg Oral QAC breakfast   metoprolol tartrate  2.5 mg Intravenous Q8H   mometasone-formoterol  2 puff Inhalation BID   pantoprazole (PROTONIX) IV  40 mg Intravenous Q12H   prochlorperazine  5 mg Intravenous Once   sucralfate  1 g Oral TID WC & HS   traMADol  50 mg Oral Once   umeclidinium bromide  1 puff Inhalation Daily   Continuous Infusions:  sodium chloride 75 mL/hr at 05/03/22 0732   potassium chloride 10 mEq (05/03/22 1106)     LOS: 0 days    Time spent: 40 minutes    Irine Seal, MD [facility]   To contact the attending provider between 7A-7P or the covering provider during after hours 7P-7A, please log into the web site [email or web address] and access using universal [place] password for that web site. If you do not have the password, please call the hospital operator.  05/03/2022, 12:49 PM

## 2022-05-03 NOTE — Progress Notes (Signed)
Date and time results received: 05/03/22 0648 (use smartphrase ".now" to insert current time)  Test: potassium Critical Value: 2.5  Name of Provider Notified: Gershon Cull, NP  Orders Received? Or Actions Taken?: Responded, awaiting orders

## 2022-05-04 ENCOUNTER — Encounter (HOSPITAL_COMMUNITY)
Admission: EM | Disposition: A | Discharge status: Home / Self Care | Payer: Self-pay | Source: Home / Self Care | Attending: Internal Medicine

## 2022-05-04 ENCOUNTER — Encounter (HOSPITAL_COMMUNITY): Payer: Self-pay | Admitting: Internal Medicine

## 2022-05-04 ENCOUNTER — Inpatient Hospital Stay (HOSPITAL_COMMUNITY): Payer: Medicare Other | Admitting: Anesthesiology

## 2022-05-04 DIAGNOSIS — J449 Chronic obstructive pulmonary disease, unspecified: Secondary | ICD-10-CM | POA: Diagnosis not present

## 2022-05-04 DIAGNOSIS — Z794 Long term (current) use of insulin: Secondary | ICD-10-CM

## 2022-05-04 DIAGNOSIS — Z87891 Personal history of nicotine dependence: Secondary | ICD-10-CM

## 2022-05-04 DIAGNOSIS — R110 Nausea: Secondary | ICD-10-CM

## 2022-05-04 DIAGNOSIS — I10 Essential (primary) hypertension: Secondary | ICD-10-CM

## 2022-05-04 DIAGNOSIS — K920 Hematemesis: Secondary | ICD-10-CM | POA: Diagnosis not present

## 2022-05-04 DIAGNOSIS — E1100 Type 2 diabetes mellitus with hyperosmolarity without nonketotic hyperglycemic-hyperosmolar coma (NKHHC): Secondary | ICD-10-CM

## 2022-05-04 HISTORY — PX: ESOPHAGOGASTRODUODENOSCOPY: SHX5428

## 2022-05-04 LAB — CBC WITH DIFFERENTIAL/PLATELET
Abs Immature Granulocytes: 0.03 10*3/uL (ref 0.00–0.07)
Basophils Absolute: 0.1 10*3/uL (ref 0.0–0.1)
Basophils Relative: 1 %
Eosinophils Absolute: 0.2 10*3/uL (ref 0.0–0.5)
Eosinophils Relative: 3 %
HCT: 40.3 % (ref 36.0–46.0)
Hemoglobin: 12.6 g/dL (ref 12.0–15.0)
Immature Granulocytes: 0 %
Lymphocytes Relative: 21 %
Lymphs Abs: 1.5 10*3/uL (ref 0.7–4.0)
MCH: 27.4 pg (ref 26.0–34.0)
MCHC: 31.3 g/dL (ref 30.0–36.0)
MCV: 87.6 fL (ref 80.0–100.0)
Monocytes Absolute: 0.7 10*3/uL (ref 0.1–1.0)
Monocytes Relative: 10 %
Neutro Abs: 4.7 10*3/uL (ref 1.7–7.7)
Neutrophils Relative %: 65 %
Platelets: 190 10*3/uL (ref 150–400)
RBC: 4.6 MIL/uL (ref 3.87–5.11)
RDW: 14.6 % (ref 11.5–15.5)
WBC: 7.2 10*3/uL (ref 4.0–10.5)
nRBC: 0 % (ref 0.0–0.2)

## 2022-05-04 LAB — GLUCOSE, CAPILLARY
Glucose-Capillary: 76 mg/dL (ref 70–99)
Glucose-Capillary: 77 mg/dL (ref 70–99)

## 2022-05-04 LAB — BASIC METABOLIC PANEL
Anion gap: 9 (ref 5–15)
BUN: 13 mg/dL (ref 8–23)
CO2: 22 mmol/L (ref 22–32)
Calcium: 8.5 mg/dL — ABNORMAL LOW (ref 8.9–10.3)
Chloride: 107 mmol/L (ref 98–111)
Creatinine, Ser: 0.6 mg/dL (ref 0.44–1.00)
GFR, Estimated: >60 mL/min (ref >60)
Glucose, Bld: 76 mg/dL (ref 70–99)
Potassium: 2.9 mmol/L — ABNORMAL LOW (ref 3.5–5.1)
Sodium: 138 mmol/L (ref 135–145)

## 2022-05-04 LAB — SARS CORONAVIRUS 2 BY RT PCR: SARS Coronavirus 2 by RT PCR: NEGATIVE

## 2022-05-04 LAB — MAGNESIUM: Magnesium: 1.7 mg/dL (ref 1.7–2.4)

## 2022-05-04 SURGERY — EGD (ESOPHAGOGASTRODUODENOSCOPY)
Anesthesia: Monitor Anesthesia Care

## 2022-05-04 MED ORDER — POTASSIUM CHLORIDE 10 MEQ/100ML IV SOLN
10.0000 meq | INTRAVENOUS | Status: DC
  Administered 2022-05-04 (×3): 10 meq via INTRAVENOUS
  Filled 2022-05-04: qty 100

## 2022-05-04 MED ORDER — MAGNESIUM SULFATE 2 GM/50ML IV SOLN
2.0000 g | Freq: Once | INTRAVENOUS | Status: AC
  Administered 2022-05-04: 2 g via INTRAVENOUS
  Filled 2022-05-04: qty 50

## 2022-05-04 MED ORDER — TRAMADOL HCL 50 MG PO TABS: 50.0000 mg | ORAL_TABLET | Freq: Four times a day (QID) | ORAL | Status: DC | PRN

## 2022-05-04 MED ORDER — SUCRALFATE 1 GM/10ML PO SUSP: 1.0000 g | Freq: Three times a day (TID) | ORAL | 0 refills | Status: DC

## 2022-05-04 MED ORDER — SODIUM CHLORIDE 0.9 % IV SOLN: INTRAVENOUS | Status: DC

## 2022-05-04 MED ORDER — PANTOPRAZOLE SODIUM 40 MG PO TBEC: 40.0000 mg | DELAYED_RELEASE_TABLET | Freq: Two times a day (BID) | ORAL | 0 refills | Status: DC

## 2022-05-04 MED ORDER — LIDOCAINE 2% (20 MG/ML) 5 ML SYRINGE
INTRAMUSCULAR | Status: DC | PRN
  Administered 2022-05-04: 50 mg via INTRAVENOUS

## 2022-05-04 MED ORDER — LACTATED RINGERS IV SOLN: INTRAVENOUS | Status: DC | PRN

## 2022-05-04 MED ORDER — PROPOFOL 10 MG/ML IV BOLUS
INTRAVENOUS | Status: DC | PRN
  Administered 2022-05-04: 20 mg via INTRAVENOUS
  Administered 2022-05-04: 30 mg via INTRAVENOUS
  Administered 2022-05-04: 10 mg via INTRAVENOUS

## 2022-05-04 MED ORDER — PROPOFOL 500 MG/50ML IV EMUL
INTRAVENOUS | Status: DC | PRN
  Administered 2022-05-04: 100 ug/kg/min via INTRAVENOUS

## 2022-05-04 MED ORDER — ONDANSETRON HCL 4 MG/2ML IJ SOLN
INTRAMUSCULAR | Status: DC | PRN
  Administered 2022-05-04: 4 mg via INTRAVENOUS

## 2022-05-04 NOTE — Hospital Course (Signed)
88 year old female history of COPD, hypothyroidism, diabetes, hypertension, BPPV, presenting with nausea vomiting hematemesis.  Patient noted 3 days ago to have taken a bunch of vitamins and the next morning felt nauseous and began vomiting, had some coffee-ground emesis.  Patient noted to have had similar symptoms in the past.  Patient denies any dyspnea, no chest pain, no palpitation, no lightheadedness or dizziness on presentation.  Patient admitted, GI consulted for upper endoscopy.  Upper endoscopy initially scheduled for 05/03/2022 however patient refused and endoscopy canceled.  Patient subsequently later on in the day 05/03/2022 changed her mind and wanted upper endoscopy for further evaluation.  Patient scheduled for upper endoscopy 05/04/2022

## 2022-05-04 NOTE — Discharge Summary (Signed)
Physician Discharge Summary   Patient: Darlene Lloyd MRN: [id number] DOB: 1932-09-19  Admit date:     05/02/2022  Discharge date: 05/04/22  Discharge Physician: Marylu Lund   PCP: Daine Floras, MD   Recommendations at discharge:    Follow up with PCP in 1-2 weeks  Discharge Diagnoses: Principal Problem:   Hematemesis Active Problems:   HTN (hypertension)   COPD (chronic obstructive pulmonary disease) (HCC)   DM2 (diabetes mellitus, type 2) (HCC)   Hypothyroidism   Anxiety  Resolved Problems:   * No resolved hospital problems. *  Hospital Course: 88 year old female history of COPD, hypothyroidism, diabetes, hypertension, BPPV, presenting with nausea vomiting hematemesis.  Patient noted 3 days ago to have taken a bunch of vitamins and the next morning felt nauseous and began vomiting, had some coffee-ground emesis.  Patient noted to have had similar symptoms in the past.  Patient denies any dyspnea, no chest pain, no palpitation, no lightheadedness or dizziness on presentation.  Patient admitted, GI consulted for upper endoscopy.  Upper endoscopy initially scheduled for 05/03/2022 however patient refused and endoscopy canceled.  Patient subsequently later on in the day 05/03/2022 changed her mind and wanted upper endoscopy for further evaluation.  Patient scheduled for upper endoscopy 05/04/2022  Assessment and Plan: #1 nausea vomiting/hematemesis -Patient admitted with hematemesis, patient with clinical improvement this morning. -Patient initially seen by GI and initially refused EGD however later changed her mind and requesting to have EGD done.   -Hemoglobin remained stable -Pt underwent EGD 10/4 with findings of erosive gastropathy without bleeding and no stigmata of recent bleed.  -GI recs for continued PPI   2.  Orthostatic hypotension -With complaints of lightheadedness and dizziness with positional initially -Orthostatics checked this morning and noted to be  positive. -Given IVF hydration   3.  Hypokalemia -Likely secondary to GI losses. -replaced   4.  COPD -Continue Dulera, Incruse.   5.  Hypertension -Was placed on IV Lopressor 2.5 mg every 8 hours, however due to hypotension held Lopressor. -BP became elevated, to resume home meds on d/c   6.  Hypothyroidism -Continue home regimen Synthroid.   7.  Anxiety -Continue home regimen Xanax as needed.   8. DM2 -Hemoglobin A1c 5.8. -SSI.       Consultants: GI Procedures performed: EGD  Disposition: Home Diet recommendation:  Carb modified diet DISCHARGE MEDICATION: Allergies as of 05/04/2022       Reactions   Codeine Other (See Comments)   UNK   Sulfasalazine Other (See Comments)   UNK reaction        Medication List     STOP taking these medications    celecoxib 200 MG capsule Commonly known as: CELEBREX   esomeprazole 20 MG capsule Commonly known as: [place] A-D PO       TAKE these medications    albuterol 108 (90 Base) MCG/ACT inhaler Commonly known as: VENTOLIN HFA Inhale 2 puffs into the lungs every 4 (four) hours as needed for wheezing or shortness of breath.   ALPRAZolam 0.25 MG tablet Commonly known as: XANAX Take 0.25 mg by mouth at bedtime as needed for anxiety.   AMLODIPINE BESYLATE PO Take 10 mg by mouth daily.   ascorbic acid 500 MG tablet Commonly known as: VITAMIN C Take 500 mg by mouth daily.   bisacodyl 5 MG EC tablet Generic drug: bisacodyl Take 10 mg by mouth daily as needed for moderate constipation.   Breztri Aerosphere 160-9-4.8 MCG/ACT Aero Generic drug:  Budeson-Glycopyrrol-Formoterol Inhale 2 puffs into the lungs 2 (two) times daily.   dicyclomine 20 MG tablet Commonly known as: BENTYL Take 20 mg by mouth 3 (three) times daily as needed for spasms.   escitalopram 20 MG tablet Commonly known as: LEXAPRO Take 20 mg by mouth daily.   fluticasone 50 MCG/ACT nasal spray Commonly known as: FLONASE Place 2  sprays into both nostrils daily as needed for allergies.   hyoscyamine 0.375 MG 12 hr tablet Commonly known as: LEVBID Take 0.375 mg by mouth every 12 (twelve) hours as needed for cramping.   levothyroxine 150 MCG tablet Commonly known as: SYNTHROID Take 150 mcg by mouth daily before breakfast.   losartan 50 MG tablet Commonly known as: COZAAR Take 50 mg by mouth daily.   MAGNESIUM PO Take 250 mg by mouth daily.   MULTIPLE VITAMIN PO Take 1 tablet by mouth daily.   pantoprazole 40 MG tablet Commonly known as: Protonix Take 1 tablet (40 mg total) by mouth 2 (two) times daily.   Potassium 99 MG Tabs Take 99 mg by mouth daily.   pyridOXINE 100 MG tablet Commonly known as: VITAMIN B6 Take 100 mg by mouth daily.   Vitamin D3 50 MCG (2000 UT) capsule Take 2,000 Units by mouth daily.        Follow-up Information     Daine Floras, MD Follow up in 1 week(s).   Why: Hospital follow up Contact information: [address] [address] [phone]                Discharge Exam: Danley Danker Weights   05/02/22 0025 05/02/22 1334 05/04/22 1320  Weight: 77.1 kg 77.1 kg 77.1 kg   General exam: Awake, laying in bed, in nad Respiratory system: Normal respiratory effort, no wheezing Cardiovascular system: regular rate, s1, s2 Gastrointestinal system: Soft, nondistended, positive BS Central nervous system: CN2-12 grossly intact, strength intact Extremities: Perfused, no clubbing Skin: Normal skin turgor, no notable skin lesions seen Psychiatry: Mood normal // no visual hallucinations   Condition at discharge: fair  The results of significant diagnostics from this hospitalization (including imaging, microbiology, ancillary and laboratory) are listed below for reference.   Imaging Studies: CT ABDOMEN PELVIS W CONTRAST  Result Date: 05/02/2022 CLINICAL DATA:  Vomiting blood.  Abdominal pain. EXAM: CT ABDOMEN AND PELVIS WITH CONTRAST TECHNIQUE: Multidetector CT  imaging of the abdomen and pelvis was performed using the standard protocol following bolus administration of intravenous contrast. RADIATION DOSE REDUCTION: This exam was performed according to the departmental dose-optimization program which includes automated exposure control, adjustment of the mA and/or kV according to patient size and/or use of iterative reconstruction technique. CONTRAST:  134m OMNIPAQUE IOHEXOL 300 MG/ML  SOLN COMPARISON:  08/26/2020. FINDINGS: Lower chest: A architectural distortion and scarring noted both lung bases, similar to prior. No pleural effusion. Small hiatal hernia noted. Hepatobiliary: No suspicious focal abnormality within the liver parenchyma. Scattered calcified granulomata are evident within the liver parenchyma. Several 7-8 mm calcified gallstones noted without gallbladder wall thickening or pericholecystic fluid. No intrahepatic or extrahepatic biliary dilation. Pancreas: No focal mass lesion. No dilatation of the main duct. No intraparenchymal cyst. No peripancreatic edema. Spleen: Calcified granulomata noted within the spleen. Adrenals/Urinary Tract: No adrenal nodule or mass. Cortical scarring noted both kidneys stable 14 mm low-density lesion lower pole right kidney compatible with a cyst. 8 mm exophytic lesion anterior lower pole left kidney (delay image 20/series 7) has attenuation higher than would be expected for a  simple cyst. No evidence for hydroureter. Bladder partially obscured by beam hardening artifact from right hip replacement with right-sided bladder diverticuli evident. Stomach/Bowel: Small hiatal hernia with surgical changes in the region of the esophagogastric junction. Duodenum is normally positioned as is the ligament of Treitz. No small bowel wall thickening. No small bowel dilatation. The appendix is not well visualized, but there is no edema or inflammation in the region of the cecum. Diverticuli are seen scattered along the entire length of the  colon without CT findings of diverticulitis. Extensive diverticular disease noted in the sigmoid colon possible tethering of the vaginal cuff to the sigmoid colonic segment. Vascular/Lymphatic: There is moderate atherosclerotic calcification of the abdominal aorta without aneurysm. There is no gastrohepatic or hepatoduodenal ligament lymphadenopathy. No retroperitoneal or mesenteric lymphadenopathy. No pelvic sidewall lymphadenopathy. Reproductive: Uterus surgically absent. Trace gas is identified in the vagina. There is no adnexal mass. Other: No intraperitoneal free fluid. Musculoskeletal: Left paraumbilical ventral hernia contains only fat with hernia sac not substantially changed measuring on the order of 5.0 x 6.1 x 3.4 cm. No fluid or edema in the hernia sac to suggest fat incarceration. Status post right total hip replacement. Advanced degenerative changes noted left hip. No worrisome lytic or sclerotic osseous abnormality. Stable L5 compression deformity. IMPRESSION: 1. No acute findings in the abdomen or pelvis. 2. Extensive diverticular disease in the colon, most advanced in the sigmoid segment. Possible tethering of the vaginal cuff to the sigmoid colonic segment with a trace amount a gas noted in the vaginal vault. Correlation for signs/symptoms of colovaginal fistula suggested. 3. Small hiatal hernia with surgical changes in the region of the esophagogastric junction. 4. Cholelithiasis. 5. 8 mm exophytic lesion anterior lower pole left kidney has attenuation higher than would be expected for a simple cyst. This may be a cyst complicated by proteinaceous debris or hemorrhage. Neoplasm not excluded. Follow-up could be used to ensure stability as clinically warranted. 6. Stable paraumbilical ventral hernia containing only fat. No complicating features. 7. Stable L5 compression deformity. 8. Aortic Atherosclerosis (ICD10-I70.0). Electronically Signed   By: Misty Stanley M.D.   On: 05/02/2022 05:54     Microbiology: Results for orders placed or performed during the hospital encounter of 05/02/22  SARS Coronavirus 2 by RT PCR (hospital order, performed in [facility] hospital lab) *cepheid single result test* Anterior Nasal Swab     Status: None   Collection Time: 05/04/22 10:23 AM   Specimen: Anterior Nasal Swab  Result Value Ref Range Status   SARS Coronavirus 2 by RT PCR NEGATIVE NEGATIVE Final    Comment: (NOTE) SARS-CoV-2 target nucleic acids are NOT DETECTED.  The SARS-CoV-2 RNA is generally detectable in upper and lower respiratory specimens during the acute phase of infection. The lowest concentration of SARS-CoV-2 viral copies this assay can detect is 250 copies / mL. A negative result does not preclude SARS-CoV-2 infection and should not be used as the sole basis for treatment or other patient management decisions.  A negative result may occur with improper specimen collection / handling, submission of specimen other than nasopharyngeal swab, presence of viral mutation(s) within the areas targeted by this assay, and inadequate number of viral copies (<250 copies / mL). A negative result must be combined with clinical observations, patient history, and epidemiological information.  Fact Sheet for Patients:   https://www.patel.info/  Fact Sheet for Healthcare Providers: https://hall.com/  This test is not yet approved or  cleared by the [place] FDA and has been  authorized for detection and/or diagnosis of SARS-CoV-2 by FDA under an Emergency Use Authorization (EUA).  This EUA will remain in effect (meaning this test can be used) for the duration of the COVID-19 declaration under Section 564(b)(1) of the Act, 21 U.S.C. section 360bbb-3(b)(1), unless the authorization is terminated or revoked sooner.  Performed at [facility], [place] [address] [place], [place] [address]     Labs: CBC: Recent Labs   Lab 05/02/22 0049 05/03/22 0536 05/04/22 0734  WBC 11.1* 10.2 7.2  NEUTROABS 9.2*  --  4.7  HGB 14.1 13.1 12.6  HCT 45.0 41.2 40.3  MCV 89.6 86.4 87.6  PLT 231 226 694   Basic Metabolic Panel: Recent Labs  Lab 05/02/22 0049 05/03/22 0536 05/04/22 0734  NA 139 141 138  K 3.7 2.5* 2.9*  CL 107 107 107  CO2 '22 26 22  '$ GLUCOSE 177* 90 76  BUN 25* 17 13  CREATININE 0.77 0.80 0.60  CALCIUM 9.2 9.1 8.5*  MG  --  1.8 1.7   Liver Function Tests: Recent Labs  Lab 05/02/22 0049 05/03/22 0536  AST 30 17  ALT 15 13  ALKPHOS 66 62  BILITOT 1.2 0.8  PROT 6.4* 6.2*  ALBUMIN 3.6 3.5   CBG: Recent Labs  Lab 05/03/22 1150 05/03/22 1626 05/03/22 2206 05/04/22 0749 05/04/22 1133  GLUCAP 93 85 83 76 77    Discharge time spent: less than 30 minutes.  Signed: Marylu Lund, MD [facility] 05/04/2022

## 2022-05-04 NOTE — Transfer of Care (Signed)
Immediate Anesthesia Transfer of Care Note  Patient: Darlene Lloyd  Procedure(s) Performed: ESOPHAGOGASTRODUODENOSCOPY (EGD)  Patient Location: PACU and Endoscopy Unit  Anesthesia Type:MAC  Level of Consciousness: awake, alert  and oriented  Airway & Oxygen Therapy: Patient Spontanous Breathing and Patient connected to face mask oxygen  Post-op Assessment: Report given to RN and Post -op Vital signs reviewed and stable  Post vital signs: Reviewed and stable  Last Vitals:  Vitals Value Taken Time  BP    Temp    Pulse 78 05/04/22 1433  Resp 23 05/04/22 1433  SpO2 100 % 05/04/22 1433  Vitals shown include unvalidated device data.  Last Pain:  Vitals:   05/04/22 1320  TempSrc: Oral  PainSc: 5       Patients Stated Pain Goal: 3 (81/15/72 6203)  Complications: No notable events documented.

## 2022-05-04 NOTE — Anesthesia Procedure Notes (Signed)
Procedure Name: MAC Date/Time: 05/04/2022 2:14 PM  Performed by: Maxwell Caul, CRNAPre-anesthesia Checklist: Patient identified, Emergency Drugs available, Suction available and Patient being monitored Oxygen Delivery Method: Simple face mask

## 2022-05-04 NOTE — Anesthesia Preprocedure Evaluation (Addendum)
Anesthesia Evaluation  Patient identified by MRN, date of birth, ID band Patient awake    Reviewed: Allergy & Precautions, H&P , NPO status , Patient's Chart, lab work & pertinent test results  Airway Mallampati: II  TM Distance: >3 FB Neck ROM: Full    Dental no notable dental hx. (+) Teeth Intact, Dental Advisory Given   Pulmonary asthma , COPD,  COPD inhaler, former smoker,    Pulmonary exam normal breath sounds clear to auscultation       Cardiovascular hypertension, Pt. on medications + Valvular Problems/Murmurs AS  Rhythm:Regular Rate:Normal     Neuro/Psych Anxiety Depression negative neurological ROS     GI/Hepatic Neg liver ROS, GERD  Medicated,  Endo/Other  Hypothyroidism   Renal/GU negative Renal ROS  negative genitourinary   Musculoskeletal  (+) Arthritis , Osteoarthritis,  Fibromyalgia -  Abdominal   Peds  Hematology negative hematology ROS (+)   Anesthesia Other Findings   Reproductive/Obstetrics negative OB ROS                            Anesthesia Physical Anesthesia Plan  ASA: 3  Anesthesia Plan: MAC   Post-op Pain Management: Minimal or no pain anticipated   Induction: Intravenous  PONV Risk Score and Plan: 2 and Propofol infusion and Treatment may vary due to age or medical condition  Airway Management Planned: Natural Airway and Simple Face Mask  Additional Equipment:   Intra-op Plan:   Post-operative Plan:   Informed Consent: I have reviewed the patients History and Physical, chart, labs and discussed the procedure including the risks, benefits and alternatives for the proposed anesthesia with the patient or authorized representative who has indicated his/her understanding and acceptance.     Dental advisory given  Plan Discussed with: CRNA  Anesthesia Plan Comments:         Anesthesia Quick Evaluation

## 2022-05-04 NOTE — Op Note (Signed)
[facility] Patient Name: Darlene Lloyd Procedure Date: 05/04/2022 MRN: [id number] Attending MD: Ronnette Juniper , MD Date of Birth: 10/11/1932 CSN: [id number] Age: 88 Admit Type: Inpatient Procedure:                Upper GI endoscopy Indications:              Coffee-ground emesis Providers:                Ronnette Juniper, MD, Velva Harman, RN, William Dalton, Technician Referring MD:             [facility] Medicines:                Monitored Anesthesia Care Complications:            No immediate complications. Estimated Blood Loss:     Estimated blood loss: none. Procedure:                Pre-Anesthesia Assessment:                           - Prior to the procedure, a History and Physical                            was performed, and patient medications and                            allergies were reviewed. The patient's tolerance of                            previous anesthesia was also reviewed. The risks                            and benefits of the procedure and the sedation                            options and risks were discussed with the patient.                            All questions were answered, and informed consent                            was obtained. Prior Anticoagulants: The patient has                            taken no previous anticoagulant or antiplatelet                            agents. ASA Grade Assessment: III - A patient with                            severe systemic disease. After reviewing the risks  and benefits, the patient was deemed in                            satisfactory condition to undergo the procedure.                           After obtaining informed consent, the endoscope was                            passed under direct vision. Throughout the                            procedure, the patient's blood pressure, pulse, and                            oxygen  saturations were monitored continuously. The                            [id number]-[id number] ([id number]) Olympus endoscope was introduced                            through the mouth, and advanced to the second part                            of duodenum. The upper GI endoscopy was                            accomplished without difficulty. The patient                            tolerated the procedure well. Scope In: Scope Out: Findings:      The middle third of the esophagus and lower third of the esophagus were       significantly tortuous.      The Z-line was found 38 cm from the incisors.      Evidence of a previous surgical anastomosis was found in the gastric       fundus.      A few localized small erosions with no bleeding and no stigmata of       recent bleeding were found in the gastric body.      The incisura, gastric antrum and pylorus were normal.      The examined duodenum was normal.      LA Grade A (one or more mucosal breaks less than 5 mm, not extending       between tops of 2 mucosal folds) esophagitis with no bleeding was found       37 to 38 cm from the incisors. Impression:               - Tortuous esophagus.                           - Z-line, 38 cm from the incisors.                           - A previous surgical anastomosis was found.                           -  Erosive gastropathy with no bleeding and no                            stigmata of recent bleeding.                           - Normal incisura, antrum and pylorus.                           - Normal examined duodenum.                           - LA Grade A esophagitis with no bleeding.                           - No specimens collected. Moderate Sedation:      Patient did not receive moderate sedation for this procedure, but       instead received monitored anesthesia care. Recommendation:           - Resume regular diet.                           - Continue present medications. Procedure Code(s):        ---  Professional ---                           [id number], Esophagogastroduodenoscopy, flexible,                            transoral; diagnostic, including collection of                            specimen(s) by brushing or washing, when performed                            (separate procedure) Diagnosis Code(s):        --- Professional ---                           Q39.9, Congenital malformation of esophagus,                            unspecified                           Z98.0, Intestinal bypass and anastomosis status                           K31.89, Other diseases of stomach and duodenum                           K20.90, Esophagitis, unspecified without bleeding                           K92.0, Hematemesis CPT copyright 2019 [employer]. All rights reserved. The codes documented in this report are preliminary and upon coder review may  be revised to meet current compliance requirements. Megan Salon  Therisa Doyne, MD 05/04/2022 2:34:47 PM This report has been signed electronically. Number of Addenda: 0

## 2022-05-04 NOTE — TOC Initial Note (Signed)
Transition of Care [facility]) - Initial/Assessment Note    Patient Details  Name: Darlene Lloyd MRN: [id number] Date of Birth: Jul 24, 1933  Transition of Care [facility]) CM/SW Contact:    Leeroy Cha, RN Phone Number: 05/04/2022, 11:03 AM  Clinical Narrative:                  Transition of Care [facility]) Screening Note   Patient Details  Name: Darlene Lloyd Date of Birth: 01/05/1933   Transition of Care [facility]) CM/SW Contact:    Leeroy Cha, RN Phone Number: 05/04/2022, 11:04 AM    Transition of Care [facility]) has reviewed patient and no TOC needs have been identified at this time. We will continue to monitor patient advancement through interdisciplinary progression rounds. If new patient transition needs arise, please place a TOC consult.    Expected Discharge Plan: Home/Self Care Barriers to Discharge: Continued Medical Work up   Patient Goals and CMS Choice Patient states their goals for this hospitalization and ongoing recovery are:: to go home CMS Medicare.gov Compare Post Acute Care list provided to:: Patient    Expected Discharge Plan and Services Expected Discharge Plan: Home/Self Care   Discharge Planning Services: CM Consult   Living arrangements for the past 2 months: Single Family Home                                      Prior Living Arrangements/Services Living arrangements for the past 2 months: Single Family Home Lives with:: Spouse Patient language and need for interpreter reviewed:: Yes Do you feel safe going back to the place where you live?: Yes            Criminal Activity/Legal Involvement Pertinent to Current Situation/Hospitalization: No - Comment as needed  Activities of Daily Living Home Assistive Devices/Equipment: Hearing aid, Eyeglasses, Walker (specify type), Wheelchair (bilateral hearing aides, reading glasses) ADL Screening (condition at time of admission) Patient's cognitive ability adequate to safely complete  daily activities?: Yes Is the patient deaf or have difficulty hearing?: Yes (bilateral hearing aides) Does the patient have difficulty seeing, even when wearing glasses/contacts?: No Does the patient have difficulty concentrating, remembering, or making decisions?: No Patient able to express need for assistance with ADLs?: Yes Does the patient have difficulty dressing or bathing?: No Independently performs ADLs?: No Communication: Independent Dressing (OT): Independent Grooming: Independent Feeding: Independent Bathing: Independent Toileting: Needs assistance Is this a change from baseline?: Pre-admission baseline In/Out Bed: Needs assistance Is this a change from baseline?: Pre-admission baseline Walks in Home: Needs assistance Is this a change from baseline?: Pre-admission baseline Does the patient have difficulty walking or climbing stairs?: Yes Weakness of Legs: Both Weakness of Arms/Hands: Both  Permission Sought/Granted                  Emotional Assessment Appearance:: Appears stated age Attitude/Demeanor/Rapport: Engaged Affect (typically observed): Calm Orientation: : Oriented to Self, Oriented to Place, Oriented to  Time, Oriented to Situation Alcohol / Substance Use: Never Used Psych Involvement: No (comment)  Admission diagnosis:  Hematemesis [K92.0] Hematemesis with nausea [K92.0] Patient Active Problem List   Diagnosis Date Noted   Hematemesis 05/02/2022   COPD (chronic obstructive pulmonary disease) ([place]) 05/02/2022   DM2 (diabetes mellitus, type 2) ([place]) 05/02/2022   Hypothyroidism 05/02/2022   Anxiety 05/02/2022   Mitral regurgitation 04/13/2020   HTN (hypertension) 04/13/2020   PCP:  Daine Floras, MD  Pharmacy:   [facility] [address] [place] [address] Phone: [phone] Fax: [phone]     Social Determinants of Health (SDOH)  Interventions    Readmission Risk Interventions   No data to display

## 2022-05-05 NOTE — Anesthesia Postprocedure Evaluation (Signed)
Anesthesia Post Note  Patient: Darlene Lloyd  Procedure(s) Performed: ESOPHAGOGASTRODUODENOSCOPY (EGD)     Patient location during evaluation: Endoscopy Anesthesia Type: MAC Level of consciousness: awake and alert Pain management: pain level controlled Vital Signs Assessment: post-procedure vital signs reviewed and stable Respiratory status: spontaneous breathing, nonlabored ventilation and respiratory function stable Cardiovascular status: stable and blood pressure returned to baseline Postop Assessment: no apparent nausea or vomiting Anesthetic complications: no   No notable events documented.  Last Vitals:  Vitals:   05/04/22 0947 05/04/22 1320  BP: 136/80 (!) 168/58  Pulse: 89 79  Resp:  (!) 22  Temp:  (!) 36.4 C  SpO2: 96% 93%    Last Pain:  Vitals:   05/04/22 1442  TempSrc:   PainSc: 0-No pain                 Darlene Lloyd,W. EDMOND

## 2022-05-09 ENCOUNTER — Encounter (HOSPITAL_COMMUNITY): Payer: Self-pay | Admitting: Gastroenterology

## 2022-10-09 ENCOUNTER — Inpatient Hospital Stay (HOSPITAL_COMMUNITY)
Admission: EM | Admit: 2022-10-09 | Discharge: 2022-10-12 | DRG: 392 | Disposition: A | Discharge status: Home / Self Care | Payer: Medicare Other | Attending: Internal Medicine | Admitting: Internal Medicine

## 2022-10-09 ENCOUNTER — Encounter (HOSPITAL_COMMUNITY): Payer: Self-pay

## 2022-10-09 ENCOUNTER — Emergency Department (HOSPITAL_COMMUNITY): Payer: Medicare Other

## 2022-10-09 ENCOUNTER — Other Ambulatory Visit: Payer: Self-pay

## 2022-10-09 DIAGNOSIS — I350 Nonrheumatic aortic (valve) stenosis: Secondary | ICD-10-CM | POA: Diagnosis present

## 2022-10-09 DIAGNOSIS — K589 Irritable bowel syndrome without diarrhea: Secondary | ICD-10-CM | POA: Diagnosis present

## 2022-10-09 DIAGNOSIS — I10 Essential (primary) hypertension: Secondary | ICD-10-CM | POA: Diagnosis present

## 2022-10-09 DIAGNOSIS — E119 Type 2 diabetes mellitus without complications: Secondary | ICD-10-CM | POA: Diagnosis present

## 2022-10-09 DIAGNOSIS — K920 Hematemesis: Secondary | ICD-10-CM | POA: Diagnosis present

## 2022-10-09 DIAGNOSIS — Z87891 Personal history of nicotine dependence: Secondary | ICD-10-CM | POA: Diagnosis not present

## 2022-10-09 DIAGNOSIS — M797 Fibromyalgia: Secondary | ICD-10-CM | POA: Diagnosis present

## 2022-10-09 DIAGNOSIS — Z882 Allergy status to sulfonamides status: Secondary | ICD-10-CM

## 2022-10-09 DIAGNOSIS — Z79890 Hormone replacement therapy (postmenopausal): Secondary | ICD-10-CM

## 2022-10-09 DIAGNOSIS — M810 Age-related osteoporosis without current pathological fracture: Secondary | ICD-10-CM | POA: Diagnosis present

## 2022-10-09 DIAGNOSIS — Z66 Do not resuscitate: Secondary | ICD-10-CM | POA: Diagnosis present

## 2022-10-09 DIAGNOSIS — E860 Dehydration: Secondary | ICD-10-CM | POA: Diagnosis present

## 2022-10-09 DIAGNOSIS — K5720 Diverticulitis of large intestine with perforation and abscess without bleeding: Principal | ICD-10-CM | POA: Diagnosis present

## 2022-10-09 DIAGNOSIS — H9190 Unspecified hearing loss, unspecified ear: Secondary | ICD-10-CM | POA: Diagnosis present

## 2022-10-09 DIAGNOSIS — F419 Anxiety disorder, unspecified: Secondary | ICD-10-CM | POA: Diagnosis present

## 2022-10-09 DIAGNOSIS — J4489 Other specified chronic obstructive pulmonary disease: Secondary | ICD-10-CM | POA: Diagnosis present

## 2022-10-09 DIAGNOSIS — Z8249 Family history of ischemic heart disease and other diseases of the circulatory system: Secondary | ICD-10-CM | POA: Diagnosis not present

## 2022-10-09 DIAGNOSIS — Z974 Presence of external hearing-aid: Secondary | ICD-10-CM

## 2022-10-09 DIAGNOSIS — E876 Hypokalemia: Secondary | ICD-10-CM | POA: Diagnosis present

## 2022-10-09 DIAGNOSIS — E872 Acidosis, unspecified: Secondary | ICD-10-CM | POA: Diagnosis present

## 2022-10-09 DIAGNOSIS — E669 Obesity, unspecified: Secondary | ICD-10-CM | POA: Diagnosis present

## 2022-10-09 DIAGNOSIS — K2100 Gastro-esophageal reflux disease with esophagitis, without bleeding: Secondary | ICD-10-CM | POA: Diagnosis present

## 2022-10-09 DIAGNOSIS — E039 Hypothyroidism, unspecified: Secondary | ICD-10-CM | POA: Diagnosis present

## 2022-10-09 DIAGNOSIS — Z6831 Body mass index (BMI) 31.0-31.9, adult: Secondary | ICD-10-CM | POA: Diagnosis not present

## 2022-10-09 DIAGNOSIS — Z853 Personal history of malignant neoplasm of breast: Secondary | ICD-10-CM

## 2022-10-09 DIAGNOSIS — Z885 Allergy status to narcotic agent status: Secondary | ICD-10-CM

## 2022-10-09 DIAGNOSIS — F32A Depression, unspecified: Secondary | ICD-10-CM | POA: Diagnosis present

## 2022-10-09 DIAGNOSIS — Z79899 Other long term (current) drug therapy: Secondary | ICD-10-CM

## 2022-10-09 DIAGNOSIS — K5792 Diverticulitis of intestine, part unspecified, without perforation or abscess without bleeding: Principal | ICD-10-CM

## 2022-10-09 DIAGNOSIS — J449 Chronic obstructive pulmonary disease, unspecified: Secondary | ICD-10-CM | POA: Diagnosis present

## 2022-10-09 DIAGNOSIS — Z86006 Personal history of melanoma in-situ: Secondary | ICD-10-CM

## 2022-10-09 LAB — COMPREHENSIVE METABOLIC PANEL
ALT: 11 U/L (ref 0–44)
AST: 19 U/L (ref 15–41)
Albumin: 3.9 g/dL (ref 3.5–5.0)
Alkaline Phosphatase: 71 U/L (ref 38–126)
Anion gap: 17 — ABNORMAL HIGH (ref 5–15)
BUN: 13 mg/dL (ref 8–23)
CO2: 19 mmol/L — ABNORMAL LOW (ref 22–32)
Calcium: 8.8 mg/dL — ABNORMAL LOW (ref 8.9–10.3)
Chloride: 103 mmol/L (ref 98–111)
Creatinine, Ser: 0.67 mg/dL (ref 0.44–1.00)
GFR, Estimated: >60 mL/min (ref >60)
Glucose, Bld: 203 mg/dL — ABNORMAL HIGH (ref 70–99)
Potassium: 2.6 mmol/L — CL (ref 3.5–5.1)
Sodium: 139 mmol/L (ref 135–145)
Total Bilirubin: 0.8 mg/dL (ref 0.3–1.2)
Total Protein: 7.1 g/dL (ref 6.5–8.1)

## 2022-10-09 LAB — LACTIC ACID, PLASMA: Lactic Acid, Venous: 1.4 mmol/L (ref 0.5–1.9)

## 2022-10-09 LAB — CBC
HCT: 45 % (ref 36.0–46.0)
Hemoglobin: 14.2 g/dL (ref 12.0–15.0)
MCH: 25.5 pg — ABNORMAL LOW (ref 26.0–34.0)
MCHC: 31.6 g/dL (ref 30.0–36.0)
MCV: 80.9 fL (ref 80.0–100.0)
Platelets: 407 10*3/uL — ABNORMAL HIGH (ref 150–400)
RBC: 5.56 MIL/uL — ABNORMAL HIGH (ref 3.87–5.11)
RDW: 15.9 % — ABNORMAL HIGH (ref 11.5–15.5)
WBC: 9.3 10*3/uL (ref 4.0–10.5)
nRBC: 0 % (ref 0.0–0.2)

## 2022-10-09 LAB — LIPASE, BLOOD: Lipase: 31 U/L (ref 11–51)

## 2022-10-09 MED ORDER — ESCITALOPRAM OXALATE 10 MG PO TABS
20.0000 mg | ORAL_TABLET | Freq: Every day | ORAL | Status: DC
  Administered 2022-10-10 – 2022-10-12 (×3): 20 mg via ORAL
  Filled 2022-10-09 (×3): qty 2

## 2022-10-09 MED ORDER — SODIUM CHLORIDE 0.9 % IV SOLN
2.0000 g | Freq: Once | INTRAVENOUS | Status: AC
  Administered 2022-10-09: 2 g via INTRAVENOUS
  Filled 2022-10-09: qty 12.5

## 2022-10-09 MED ORDER — POTASSIUM CHLORIDE 10 MEQ/100ML IV SOLN
10.0000 meq | INTRAVENOUS | Status: AC
  Administered 2022-10-09 (×2): 10 meq via INTRAVENOUS
  Filled 2022-10-09 (×2): qty 100

## 2022-10-09 MED ORDER — MORPHINE SULFATE (PF) 2 MG/ML IV SOLN: 2.0000 mg | INTRAVENOUS | Status: DC | PRN

## 2022-10-09 MED ORDER — ALBUTEROL SULFATE (2.5 MG/3ML) 0.083% IN NEBU: 2.5000 mg | INHALATION_SOLUTION | RESPIRATORY_TRACT | Status: DC | PRN

## 2022-10-09 MED ORDER — SODIUM CHLORIDE 0.9% FLUSH
3.0000 mL | Freq: Two times a day (BID) | INTRAVENOUS | Status: DC
  Administered 2022-10-09 – 2022-10-12 (×6): 3 mL via INTRAVENOUS

## 2022-10-09 MED ORDER — HYDRALAZINE HCL 20 MG/ML IJ SOLN: 5.0000 mg | INTRAMUSCULAR | Status: DC | PRN

## 2022-10-09 MED ORDER — PIPERACILLIN-TAZOBACTAM 3.375 G IVPB
3.3750 g | Freq: Three times a day (TID) | INTRAVENOUS | Status: DC
  Administered 2022-10-09 – 2022-10-11 (×5): 3.375 g via INTRAVENOUS
  Filled 2022-10-09 (×5): qty 50

## 2022-10-09 MED ORDER — PANTOPRAZOLE SODIUM 40 MG IV SOLR
40.0000 mg | Freq: Two times a day (BID) | INTRAVENOUS | Status: DC
  Administered 2022-10-09 – 2022-10-12 (×6): 40 mg via INTRAVENOUS
  Filled 2022-10-09 (×6): qty 10

## 2022-10-09 MED ORDER — PANTOPRAZOLE SODIUM 40 MG IV SOLR
40.0000 mg | Freq: Once | INTRAVENOUS | Status: AC
  Administered 2022-10-09: 40 mg via INTRAVENOUS
  Filled 2022-10-09: qty 10

## 2022-10-09 MED ORDER — ONDANSETRON HCL 4 MG/2ML IJ SOLN: 4.0000 mg | Freq: Four times a day (QID) | INTRAMUSCULAR | Status: DC | PRN

## 2022-10-09 MED ORDER — AMLODIPINE BESYLATE 5 MG PO TABS
10.0000 mg | ORAL_TABLET | Freq: Every day | ORAL | Status: DC
  Administered 2022-10-10 – 2022-10-12 (×3): 10 mg via ORAL
  Filled 2022-10-09 (×3): qty 2

## 2022-10-09 MED ORDER — ALPRAZOLAM 0.25 MG PO TABS: 0.2500 mg | ORAL_TABLET | Freq: Every evening | ORAL | Status: DC | PRN

## 2022-10-09 MED ORDER — AMLODIPINE BESYLATE 5 MG PO TABS: 10.0000 mg | ORAL_TABLET | Freq: Every day | ORAL | Status: DC

## 2022-10-09 MED ORDER — LEVOTHYROXINE SODIUM 50 MCG PO TABS
150.0000 ug | ORAL_TABLET | Freq: Every day | ORAL | Status: DC
  Administered 2022-10-11 – 2022-10-12 (×2): 150 ug via ORAL
  Filled 2022-10-09 (×3): qty 1

## 2022-10-09 MED ORDER — ACETAMINOPHEN 650 MG RE SUPP: 650.0000 mg | Freq: Four times a day (QID) | RECTAL | Status: DC | PRN

## 2022-10-09 MED ORDER — IOHEXOL 300 MG/ML  SOLN
100.0000 mL | Freq: Once | INTRAMUSCULAR | Status: AC | PRN
  Administered 2022-10-09: 100 mL via INTRAVENOUS

## 2022-10-09 MED ORDER — ACETAMINOPHEN 325 MG PO TABS
650.0000 mg | ORAL_TABLET | Freq: Four times a day (QID) | ORAL | Status: DC | PRN
  Administered 2022-10-10 – 2022-10-11 (×3): 650 mg via ORAL
  Filled 2022-10-09 (×3): qty 2

## 2022-10-09 MED ORDER — METRONIDAZOLE 500 MG/100ML IV SOLN
500.0000 mg | Freq: Once | INTRAVENOUS | Status: AC
  Administered 2022-10-09: 500 mg via INTRAVENOUS
  Filled 2022-10-09: qty 100

## 2022-10-09 MED ORDER — ONDANSETRON HCL 4 MG PO TABS: 4.0000 mg | ORAL_TABLET | Freq: Four times a day (QID) | ORAL | Status: DC | PRN

## 2022-10-09 MED ORDER — LACTATED RINGERS IV SOLN: INTRAVENOUS | Status: DC

## 2022-10-09 MED ORDER — METOCLOPRAMIDE HCL 5 MG/ML IJ SOLN
10.0000 mg | Freq: Once | INTRAMUSCULAR | Status: AC
  Administered 2022-10-09: 10 mg via INTRAVENOUS

## 2022-10-09 NOTE — ED Triage Notes (Addendum)
Pt BIB [facility] from residential living at the [facility]. Pt presents with N/V x 3 days.  Denies fever, chills. EMS admin '4mg'$  of Zofran and 200 mL of NaCl.  EMS Vitals    195/90 HR 104 RR 22 CBG 245  Pt still vomiting despite zofran. Noted pt to have dark hematemesis.

## 2022-10-09 NOTE — ED Provider Notes (Signed)
Patient initially seen by Dr. Mayra Neer.  Please see her note.  Plan was to follow-up on the patient's CT scan.  CT scan shows evidence of diverticulitis with microperforation no evidence of abscess. IV abx started.  Discussed findings with patient.  She appears well, non toxic.  Case discussed with discussed with Dr Lorin Mercy regarding admission.   Dorie Rank, MD 10/09/22 [phone]

## 2022-10-09 NOTE — Progress Notes (Signed)
PHARMACY NOTE -  [facility] has been assisting with dosing of Zosyn for intra-abdominal infection. Dosage remains stable at 3.375 g IV q8 hr and further renal adjustments per institutional Pharmacy antibiotic protocol  Pharmacy will sign off, following peripherally for culture results, dose adjustments, and length of therapy. Please reconsult if a change in clinical status warrants re-evaluation of dosage.  Reuel Boom, PharmD, BCPS [phone] 10/09/2022, 5:40 PM

## 2022-10-09 NOTE — ED Notes (Signed)
ED TO INPATIENT HANDOFF REPORT  ED Nurse Name and Phone #: Varney Biles. Owens Shark [phone]  S Name/Age/Gender Darlene Lloyd 89 y.o. female Room/Bed: WA04/WA04  Code Status   Code Status: Prior  Home/SNF/Other Home Patient oriented to: self, place, time, and situation Is this baseline? Yes   Triage Complete: Triage complete  Chief Complaint Diverticulitis of colon with perforation [K57.20]  Triage Note Pt BIB [facility] from residential living at the [facility] home. Pt presents with N/V x 3 days.  Denies fever, chills. EMS admin '4mg'$  of Zofran and 200 mL of NaCl.  EMS Vitals    195/90 HR 104 RR 22 CBG 245  Pt still vomiting despite zofran. Noted pt to have dark hematemesis.     Allergies Allergies  Allergen Reactions   Codeine Other (See Comments)    UNK   Sulfasalazine Other (See Comments)    UNK reaction    Level of Care/Admitting Diagnosis ED Disposition     ED Disposition  Admit   Condition  --   Comment  [facility] [[id number]]  Level of Care: Telemetry [5]  Admit to tele based on following criteria: Monitor QTC interval  May [facility] patient to [facility] if equivalent level of care is available:: Yes  Covid Evaluation: Asymptomatic - no recent exposure (last 10 days) testing not required  Diagnosis: Diverticulitis of colon with perforation [id number]  Admitting Physician: Karmen Bongo [[id number]]  Attending Physician: Karmen Bongo [id number]  Certification:: I certify this patient will need inpatient services for at least 2 midnights  Estimated Length of Stay: 3          B Medical/Surgery History Past Medical History:  Diagnosis Date   ANA positive    Aortic valve stenosis    Asthma    BPPV (benign paroxysmal positional vertigo)    Cancer (HCC)    breast   Cervicalgia    COPD (chronic obstructive pulmonary disease) ([place])    Depression    Diverticulosis    Fibromyalgia    GERD (gastroesophageal reflux  disease)    Hearing loss    Hernia, diaphragmatic    Hyperopia of both eyes with astigmatism and presbyopia    Hypertension    Hypothyroidism    IBS (irritable bowel syndrome)    Melanoma in situ of left upper arm ([place])    Neuropathy    Nuclear sclerosis of right eye    Obesity    Osteoporosis    Senile osteoporosis    Spinal stenosis of lumbar region with neurogenic claudication    Thoracic and lumbosacral neuritis    Vitamin D deficiency    Past Surgical History:  Procedure Laterality Date   [place]   CATARACT EXTRACTION  2012   ESOPHAGOGASTRODUODENOSCOPY N/A 05/04/2022   Procedure: ESOPHAGOGASTRODUODENOSCOPY (EGD);  Surgeon: Ronnette Juniper, MD;  Location: [facility];  Service: Gastroenterology;  Laterality: N/A;   ESOPHAGUS SURGERY  1989   hiatal  1989   laminectomy  1983   TOTAL HIP ARTHROPLASTY  2019   TOTAL KNEE ARTHROPLASTY  2001   TOTAL KNEE ARTHROPLASTY  2003     A IV Location/Drains/Wounds Patient Lines/Drains/Airways Status     Active Line/Drains/Airways     Name Placement date Placement time Site Days   Peripheral IV 10/09/22 22 G Left Forearm 10/09/22  1207  Forearm  less than 1   Peripheral IV 10/09/22 20 G  2.5" Left;Upper Arm 10/09/22  1358  Arm  less than 1            Intake/Output Last 24 hours No intake or output data in the 24 hours ending 10/09/22 1717  Labs/Imaging Results for orders placed or performed during the hospital encounter of 10/09/22 (from the past 48 hour(s))  Lipase, blood     Status: None   Collection Time: 10/09/22  1:27 PM  Result Value Ref Range   Lipase 31 11 - 51 U/L    Comment: Performed at [facility], [place] [address], [place], [place] [address]  Comprehensive metabolic panel     Status: Abnormal   Collection Time: 10/09/22  1:27 PM  Result Value Ref Range   Sodium 139 135 - 145 mmol/L   Potassium 2.6 (LL) 3.5 - 5.1 mmol/L     Comment: CRITICAL RESULT CALLED TO, READ BACK BY AND VERIFIED WITH Owens Shark, A RN [phone] 10/09/22 BY TIBBITTS,K    Chloride 103 98 - 111 mmol/L   CO2 19 (L) 22 - 32 mmol/L   Glucose, Bld 203 (H) 70 - 99 mg/dL    Comment: Glucose reference range applies only to samples taken after fasting for at least 8 hours.   BUN 13 8 - 23 mg/dL   Creatinine, Ser 0.67 0.44 - 1.00 mg/dL   Calcium 8.8 (L) 8.9 - 10.3 mg/dL   Total Protein 7.1 6.5 - 8.1 g/dL   Albumin 3.9 3.5 - 5.0 g/dL   AST 19 15 - 41 U/L   ALT 11 0 - 44 U/L   Alkaline Phosphatase 71 38 - 126 U/L   Total Bilirubin 0.8 0.3 - 1.2 mg/dL   GFR, Estimated >60 >60 mL/min    Comment: (NOTE) Calculated using the CKD-EPI Creatinine Equation (2021)    Anion gap 17 (H) 5 - 15    Comment: Performed at [facility], [place] [address], [place], [place] [address]  CBC     Status: Abnormal   Collection Time: 10/09/22  1:27 PM  Result Value Ref Range   WBC 9.3 4.0 - 10.5 K/uL   RBC 5.56 (H) 3.87 - 5.11 MIL/uL   Hemoglobin 14.2 12.0 - 15.0 g/dL   HCT 45.0 36.0 - 46.0 %   MCV 80.9 80.0 - 100.0 fL   MCH 25.5 (L) 26.0 - 34.0 pg   MCHC 31.6 30.0 - 36.0 g/dL   RDW 15.9 (H) 11.5 - 15.5 %   Platelets 407 (H) 150 - 400 K/uL   nRBC 0.0 0.0 - 0.2 %    Comment: Performed at [facility], [place] [address], [place], [place] [address]  Lactic acid, plasma     Status: None   Collection Time: 10/09/22  1:52 PM  Result Value Ref Range   Lactic Acid, Venous 1.4 0.5 - 1.9 mmol/L    Comment: Performed at [facility], [place] [address] [place], [place] [address]   CT ABDOMEN PELVIS W CONTRAST  Result Date: 10/09/2022 CLINICAL DATA:  Acute abdominal pain.  Coffee-ground emesis. EXAM: CT ABDOMEN AND PELVIS WITH CONTRAST TECHNIQUE: Multidetector CT imaging of the abdomen and pelvis was performed using the standard protocol following bolus administration of intravenous contrast. RADIATION DOSE REDUCTION: This exam was  performed according to the departmental dose-optimization program which includes automated exposure control, adjustment of the mA and/or kV according to patient size and/or use of iterative reconstruction technique. CONTRAST:  155m OMNIPAQUE IOHEXOL 300 MG/ML  SOLN COMPARISON:  CT abdomen and pelvis 05/02/2022 FINDINGS: Lower chest: No acute abnormality. Hepatobiliary: Calcified granulomas are seen throughout the liver. Gallstones are present. The gallbladder is mildly dilated without surrounding inflammation. There is no biliary ductal dilatation. Pancreas: Unremarkable. No pancreatic ductal dilatation or surrounding inflammatory changes. Spleen: Calcified granulomas are present. Adrenals/Urinary Tract: Bilateral renal cortical scarring is present. There are subcentimeter cortical hypodensities in both kidneys which are too small to characterize and unchanged. There is no hydronephrosis or perinephric fluid. The adrenal glands and bladder are within normal limits. Stomach/Bowel: There is diffuse colonic diverticulosis. There is wall thickening and inflammatory stranding of the mid sigmoid colon compatible with acute diverticulitis. Micro perforation is present. No focal abscess identified. There is no bowel obstruction. Small hiatal hernia is again seen. In the appendix is not visualized. Vascular/Lymphatic: Aortic atherosclerosis. No enlarged abdominal or pelvic lymph nodes. Reproductive: Status post hysterectomy. No adnexal masses. Other: Trace free fluid in the pelvis. Fat containing paraumbilical hernia appears unchanged. Musculoskeletal: The bones are osteopenic. L5 compression deformity is chronic and unchanged. Right hip arthroplasty is present. IMPRESSION: 1. Acute sigmoid colon diverticulitis with micro perforation. No focal abscess. 2. Cholelithiasis. 3. Subcentimeter Bosniak II renal cyst, too small to characterize. No follow-up imaging is recommended. JACR 2018 Feb; 264-273, Management of the  Incidental Renal Mass on CT, [employer] 2021; [phone], Bosniak Classification of Cystic Renal Masses, Version 2019. Aortic Atherosclerosis (ICD10-I70.0). Electronically Signed   By: Ronney Asters M.D.   On: 10/09/2022 16:06    Pending Labs Unresulted Labs (From admission, onward)     Start     Ordered   10/09/22 1253  Lactic acid, plasma  Now then every 2 hours,   R (with STAT occurrences)      10/09/22 1252            Vitals/Pain Today's Vitals   10/09/22 1430 10/09/22 1515 10/09/22 1632 10/09/22 1654  BP: (!) 140/114 (!) 118/48  (!) 162/100  Pulse: (!) 46 (!) 50  97  Resp: (!) 21 (!) 22  14  Temp:   98.2 F (36.8 C)   TempSrc:   Oral   SpO2: 98% 98%  100%  Weight:      Height:      PainSc:        Isolation Precautions No active isolations  Medications Medications  potassium chloride 10 mEq in 100 mL IVPB (10 mEq Intravenous New Bag/Given 10/09/22 1703)  ceFEPIme (MAXIPIME) 2 g in sodium chloride 0.9 % 100 mL IVPB (2 g Intravenous New Bag/Given 10/09/22 1703)    And  metroNIDAZOLE (FLAGYL) IVPB 500 mg (500 mg Intravenous New Bag/Given 10/09/22 1713)  pantoprazole (PROTONIX) injection 40 mg (40 mg Intravenous Given 10/09/22 1326)  metoCLOPramide (REGLAN) injection 10 mg (10 mg Intravenous Given 10/09/22 1319)  iohexol (OMNIPAQUE) 300 MG/ML solution 100 mL (100 mLs Intravenous Contrast Given 10/09/22 1548)    Mobility walks with device     Focused Assessments N/V with hematemesis. Abd soft and non-tender    R Recommendations: See Admitting Provider Note  Report given to:   Additional Notes:  Pt's K runs are behind schedule due to pt being off the floor when they were ordered.

## 2022-10-09 NOTE — H&P (Signed)
History and Physical    Patient: Darlene Lloyd [id number] DOB: Jun 19, 1933 DOA: 10/09/2022 DOS: the patient was seen and examined on 10/09/2022 PCP: Charlane Ferretti, MD  Patient coming from: [facility] - [facility] Lloyd, lives with husband; NOK: Darlene, Lloyd, [phone]   Chief Complaint: n/v  HPI: Darlene Lloyd is a 89 y.o. female with medical history significant of breast cancer; COPD;  HTN; hypothyroidism; and chronic pain presenting with n/v.   She reports acute onset of n/v.  Emesis has been persistent and is now quite black.  No fever.  No abdominal pain.      ER Course:  Diverticulitis with microperf, hypokalemia.  Mostly nausea, some abdominal pain.  Given IV K+.  Ordered abx.  Appears benign for now and likely non-surgical so surgery not consulted.     Review of Systems: As mentioned in the history of present illness. All other systems reviewed and are negative. Past Medical History:  Diagnosis Date   ANA positive    Aortic valve stenosis    Asthma    BPPV (benign paroxysmal positional vertigo)    Cancer (HCC)    breast   Cervicalgia    COPD (chronic obstructive pulmonary disease) (HCC)    Depression    Diverticulosis    Fibromyalgia    GERD (gastroesophageal reflux disease)    Hearing loss    Hernia, diaphragmatic    Hyperopia of both eyes with astigmatism and presbyopia    Hypertension    Hypothyroidism    IBS (irritable bowel syndrome)    Melanoma in situ of left upper arm (HCC)    Neuropathy    Nuclear sclerosis of right eye    Obesity    Osteoporosis    Senile osteoporosis    Spinal stenosis of lumbar region with neurogenic claudication    Thoracic and lumbosacral neuritis    Vitamin D deficiency    Past Surgical History:  Procedure Laterality Date   [place]   CATARACT EXTRACTION  2012   ESOPHAGOGASTRODUODENOSCOPY N/A 05/04/2022   Procedure: ESOPHAGOGASTRODUODENOSCOPY (EGD);   Surgeon: Ronnette Juniper, MD;  Location: [facility];  Service: Gastroenterology;  Laterality: N/A;   ESOPHAGUS SURGERY  1989   hiatal  1989   laminectomy  1983   TOTAL HIP ARTHROPLASTY  2019   TOTAL KNEE ARTHROPLASTY  2001   TOTAL KNEE ARTHROPLASTY  2003   Social History:  reports that she has quit smoking. She has never used smokeless tobacco. She reports current alcohol use. She reports that she does not use drugs.  Allergies  Allergen Reactions   Codeine Other (See Comments)    UNK   Sulfasalazine Other (See Comments)    UNK reaction    Family History  Problem Relation Age of Onset   Hypertension Father     Prior to Admission medications   Medication Sig Start Date End Date Taking? Authorizing Provider  albuterol (VENTOLIN HFA) 108 (90 Base) MCG/ACT inhaler Inhale 2 puffs into the lungs every 4 (four) hours as needed for wheezing or shortness of breath. 10/01/19   [provider]  ALPRAZolam Duanne Moron) 0.25 MG tablet Take 0.25 mg by mouth at bedtime as needed for anxiety.    [provider]  AMLODIPINE BESYLATE PO Take 10 mg by mouth daily.    [provider]  ascorbic acid (VITAMIN C) 500 MG tablet Take 500 mg by mouth daily.    [provider]  bisacodyl (BISACODYL) 5 MG EC tablet Take 10 mg by mouth daily as needed for moderate constipation.    [provider]  Budeson-Glycopyrrol-Formoterol (BREZTRI AEROSPHERE) 160-9-4.8 MCG/ACT AERO Inhale 2 puffs into the lungs 2 (two) times daily.    [provider]  Cholecalciferol (VITAMIN D3) 50 MCG (2000 UT) capsule Take 2,000 Units by mouth daily.    [provider]  dicyclomine (BENTYL) 20 MG tablet Take 20 mg by mouth 3 (three) times daily as needed for spasms.    [provider]  escitalopram (LEXAPRO) 20 MG tablet Take 20 mg by mouth daily. 04/27/22   [provider]  fluticasone (FLONASE) 50 MCG/ACT nasal spray Place 2 sprays into both nostrils daily as  needed for allergies.    [provider]  hyoscyamine (LEVBID) 0.375 MG 12 hr tablet Take 0.375 mg by mouth every 12 (twelve) hours as needed for cramping.    [provider]  levothyroxine (SYNTHROID) 150 MCG tablet Take 150 mcg by mouth daily before breakfast.    [provider]  losartan (COZAAR) 50 MG tablet Take 50 mg by mouth daily. 03/21/22   [provider]  MAGNESIUM PO Take 250 mg by mouth daily.    [provider]  MULTIPLE VITAMIN PO Take 1 tablet by mouth daily.    [provider]  pantoprazole (PROTONIX) 40 MG tablet Take 1 tablet (40 mg total) by mouth 2 (two) times daily. 05/04/22 06/03/22  Donne Hazel, MD  Potassium 99 MG TABS Take 99 mg by mouth daily.    [provider]  pyridOXINE (VITAMIN B-6) 100 MG tablet Take 100 mg by mouth daily.    [provider]    Physical Exam: Vitals:   10/09/22 1430 10/09/22 1515 10/09/22 1632 10/09/22 1654  BP: (!) 140/114 (!) 118/48  (!) 162/100  Pulse: (!) 46 (!) 50  97  Resp: (!) 21 (!) 22  14  Temp:   98.2 F (36.8 C)   TempSrc:   Oral   SpO2: 98% 98%  100%  Weight:      Height:       General:  Appears calm and comfortable and is in NAD, black emesis is dried on neckline of gown Eyes:  EOMI, normal lids, iris ENT:   hard of hearing, grossly normal lips & tongue, mmm Neck:  no LAD, masses or thyromegaly Cardiovascular:  RRR, no m/r/g. No LE edema.  Respiratory:   CTA bilaterally with no wheezes/rales/rhonchi.  Normal respiratory effort. Abdomen:  soft, NT, ND Skin:  no rash or induration seen on limited exam Musculoskeletal:  grossly normal tone BUE/BLE, good ROM, no bony abnormality Psychiatric:  blunted mood and affect, speech fluent and appropriate, AOx3 Neurologic:  CN 2-12 grossly intact, moves all extremities in coordinated fashion   Radiological Exams on Admission: Independently reviewed - see discussion in A/P where applicable  CT ABDOMEN  PELVIS W CONTRAST  Result Date: 10/09/2022 CLINICAL DATA:  Acute abdominal pain.  Coffee-ground emesis. EXAM: CT ABDOMEN AND PELVIS WITH CONTRAST TECHNIQUE: Multidetector CT imaging of the abdomen and pelvis was performed using the standard protocol following bolus administration of intravenous contrast. RADIATION DOSE REDUCTION: This exam was performed according to the departmental dose-optimization program which includes automated exposure control, adjustment of the mA and/or kV according to patient size and/or use of iterative reconstruction technique. CONTRAST:  122m OMNIPAQUE IOHEXOL 300 MG/ML  SOLN COMPARISON:  CT abdomen and pelvis 05/02/2022 FINDINGS: Lower chest: No acute abnormality.  Hepatobiliary: Calcified granulomas are seen throughout the liver. Gallstones are present. The gallbladder is mildly dilated without surrounding inflammation. There is no biliary ductal dilatation. Pancreas: Unremarkable. No pancreatic ductal dilatation or surrounding inflammatory changes. Spleen: Calcified granulomas are present. Adrenals/Urinary Tract: Bilateral renal cortical scarring is present. There are subcentimeter cortical hypodensities in both kidneys which are too small to characterize and unchanged. There is no hydronephrosis or perinephric fluid. The adrenal glands and bladder are within normal limits. Stomach/Bowel: There is diffuse colonic diverticulosis. There is wall thickening and inflammatory stranding of the mid sigmoid colon compatible with acute diverticulitis. Micro perforation is present. No focal abscess identified. There is no bowel obstruction. Small hiatal hernia is again seen. In the appendix is not visualized. Vascular/Lymphatic: Aortic atherosclerosis. No enlarged abdominal or pelvic lymph nodes. Reproductive: Status post hysterectomy. No adnexal masses. Other: Trace free fluid in the pelvis. Fat containing paraumbilical hernia appears unchanged. Musculoskeletal: The bones are osteopenic. L5  compression deformity is chronic and unchanged. Right hip arthroplasty is present. IMPRESSION: 1. Acute sigmoid colon diverticulitis with micro perforation. No focal abscess. 2. Cholelithiasis. 3. Subcentimeter Bosniak II renal cyst, too small to characterize. No follow-up imaging is recommended. JACR 2018 Feb; 264-273, Management of the Incidental Renal Mass on CT, [employer] 2021; 814-848, Bosniak Classification of Cystic Renal Masses, Version 2019. Aortic Atherosclerosis (ICD10-I70.0). Electronically Signed   By: Ronney Asters M.D.   On: 10/09/2022 16:06    EKG: Independently reviewed.  NSR with rate 89; prolonged QTc 523; nonspecific ST changes with no evidence of acute ischemia   Labs on Admission: I have personally reviewed the available labs and imaging studies at the time of the admission.  Pertinent labs:    K+ 2.6 CO2 19 Glucose 203 Anion gap 17 Lactate 1.4 WBC 9.3 Platelets 407   Assessment and Plan: Principal Problem:   Diverticulitis of colon with perforation Active Problems:   HTN (hypertension)   Hematemesis   COPD (chronic obstructive pulmonary disease) (HCC)   DM2 (diabetes mellitus, type 2) (HCC)   Hypothyroidism   DNR (do not resuscitate)    Diverticulitis with microperf -Patient is presenting with n/v -Emesis appears to be c/w melena -CT read of diverticulitis with micro perforation -No pain, no fever - atypical presentation -For now, will give bowel rest, IVF, pain medication with morphine, and treat with Zosyn for intraabdominal infection -Will request GI consult -Limited use of Zofran due to prolonged QTc  Hematemesis -Her primary complaint is not pain or fever but rather n/v -Emesis appears to be dark blood -Will start Protonix BID -GI consult -Prior EGD in 05/2022 with erosive gastropathy and esophagitis -She does take Celebrex  Prolonged QTc -Likely associated with dehydration, anticipate resolution once volume status is normalized -Will  attempt to avoid QT-prolonging medications such as PPI, nausea meds, SSRIs -Repeat EKG in AM   COPD -Uses prn Albuterol -Not on home O2  HTN -Continue amlodipine -Hold losartan for now in the setting of mild dehydration/anion gap acidosis  Hypothyroidism -Continue Synthroid  Anxiety -Continue escitalopram, alprazolam  DNR -I have discussed code status with the patient and her husband and  they are in agreement that the patient would not desire resuscitation and would prefer to die a natural death should that situation arise. -She will need a gold out of facility DNR form at the time of discharge      Advance Care Planning:   Code Status: DNR   Consults: GI  DVT Prophylaxis: SCDs  Family Communication: Husband  was present throughout evaluation  Severity of Illness: The appropriate patient status for this patient is INPATIENT. Inpatient status is judged to be reasonable and necessary in order to provide the required intensity of service to ensure the patient's safety. The patient's presenting symptoms, physical exam findings, and initial radiographic and laboratory data in the context of their chronic comorbidities is felt to place them at high risk for further clinical deterioration. Furthermore, it is not anticipated that the patient will be medically stable for discharge from the hospital within 2 midnights of admission.   * I certify that at the point of admission it is my clinical judgment that the patient will require inpatient hospital care spanning beyond 2 midnights from the point of admission due to high intensity of service, high risk for further deterioration and high frequency of surveillance required.*  Author: Karmen Bongo, MD 10/09/2022 5:23 PM  For on call review [email or web address].

## 2022-10-09 NOTE — ED Provider Notes (Signed)
[place] EMERGENCY DEPARTMENT AT [facility] Provider Note   CSN: [id number] Arrival date & time: 10/09/22  1157     History {Add pertinent medical, surgical, social history, OB history to HPI:1} Chief Complaint  Patient presents with  . Nausea    Darlene Lloyd is a 89 y.o. female with HTN, COPD, T2DM, hypothyroidism, reflux, IBS, fibromyalgia, h/o GIB anxiety presents with nausea/vomiting.   Pt BIB [facility] from residential living at the [facility]. Pt presents with N/V x 2 days. Initially wasn't bloody but then became bloody yesterday with dark red hematemesis, coffee grounds. Denies fever, chills, cough, SOB, chest pain, abdominal pain, melena/hematochezia, urinary symptoms. EMS gave '4mg'$  of Zofran and 200 mL of NaCl. States this has happened before last year. No AC.   Per chart review, patient was admitted for hematemesis, had EGD in 05/2022 that showed erosive gastropathy without bleeding and no stigmata of that prior bleed. Hasn't had any issues since that time.  HPI     Home Medications Prior to Admission medications   Medication Sig Start Date End Date Taking? Authorizing Provider  albuterol (VENTOLIN HFA) 108 (90 Base) MCG/ACT inhaler Inhale 2 puffs into the lungs every 4 (four) hours as needed for wheezing or shortness of breath. 10/01/19   [provider]  ALPRAZolam Duanne Moron) 0.25 MG tablet Take 0.25 mg by mouth at bedtime as needed for anxiety.    [provider]  AMLODIPINE BESYLATE PO Take 10 mg by mouth daily.    [provider]  ascorbic acid (VITAMIN C) 500 MG tablet Take 500 mg by mouth daily.    [provider]  bisacodyl (BISACODYL) 5 MG EC tablet Take 10 mg by mouth daily as needed for moderate constipation.    [provider]  Budeson-Glycopyrrol-Formoterol (BREZTRI AEROSPHERE) 160-9-4.8 MCG/ACT AERO Inhale 2 puffs into the lungs 2 (two) times daily.    [provider]  Cholecalciferol (VITAMIN D3)  50 MCG (2000 UT) capsule Take 2,000 Units by mouth daily.    [provider]  dicyclomine (BENTYL) 20 MG tablet Take 20 mg by mouth 3 (three) times daily as needed for spasms.    [provider]  escitalopram (LEXAPRO) 20 MG tablet Take 20 mg by mouth daily. 04/27/22   [provider]  fluticasone (FLONASE) 50 MCG/ACT nasal spray Place 2 sprays into both nostrils daily as needed for allergies.    [provider]  hyoscyamine (LEVBID) 0.375 MG 12 hr tablet Take 0.375 mg by mouth every 12 (twelve) hours as needed for cramping.    [provider]  levothyroxine (SYNTHROID) 150 MCG tablet Take 150 mcg by mouth daily before breakfast.    [provider]  losartan (COZAAR) 50 MG tablet Take 50 mg by mouth daily. 03/21/22   [provider]  MAGNESIUM PO Take 250 mg by mouth daily.    [provider]  MULTIPLE VITAMIN PO Take 1 tablet by mouth daily.    [provider]  pantoprazole (PROTONIX) 40 MG tablet Take 1 tablet (40 mg total) by mouth 2 (two) times daily. 05/04/22 06/03/22  Donne Hazel, MD  Potassium 99 MG TABS Take 99 mg by mouth daily.    [provider]  pyridOXINE (VITAMIN B-6) 100 MG tablet Take 100 mg by mouth daily.    [provider]      Allergies    Codeine and Sulfasalazine    Review of Systems   Review of Systems Review of  systems {pos/neg:18640::"Negative","Positive"} for ***.  A 10 point review of systems was performed and is negative unless otherwise reported in HPI.  Physical Exam Updated Vital Signs BP (!) 169/92   Pulse 96   Resp 20   Ht '5\' 2"'$  (1.575 m)   Wt 77.1 kg   SpO2 100%   BMI 31.09 kg/m  Physical Exam General: Normal appearing {Desc; female/female:11659}, lying in bed.  HEENT: PERRLA, Sclera anicteric, MMM, trachea midline.  Cardiology: RRR, no murmurs/rubs/gallops. BL radial and DP pulses equal bilaterally.  Resp: Normal respiratory rate and effort. CTAB, no  wheezes, rhonchi, crackles.  Abd: Soft, non-tender, non-distended. No rebound tenderness or guarding.  GU: Deferred. MSK: No peripheral edema or signs of trauma. Extremities without deformity or TTP. No cyanosis or clubbing. Skin: warm, dry. No rashes or lesions. Back: No CVA tenderness Neuro: A&Ox4, CNs II-XII grossly intact. MAEs. Sensation grossly intact.  Psych: Normal mood and affect.   ED Results / Procedures / Treatments   Labs (all labs ordered are listed, but only abnormal results are displayed) Labs Reviewed  LIPASE, BLOOD  COMPREHENSIVE METABOLIC PANEL  CBC    EKG None  Radiology No results found.  Procedures Procedures  {Document cardiac monitor, telemetry assessment procedure when appropriate:1}  Medications Ordered in ED Medications - No data to display  ED Course/ Medical Decision Making/ A&P                          Medical Decision Making Amount and/or Complexity of Data Reviewed Labs: ordered. Radiology: ordered.  Risk Prescription drug management.    This patient presents to the ED for concern of ***, this involves an extensive number of treatment options, and is a complaint that carries with it a high risk of complications and morbidity.  I considered the following differential and admission for this acute, potentially life threatening condition.   MDM:    ***  Clinical Course as of 10/09/22 1421  Sun Oct 09, 2022  1421 Potassium(!!): 2.6 Will replete [HN]    Clinical Course User Index [HN] Audley Hose, MD    Labs: I Ordered, and personally interpreted labs.  The pertinent results include:  ***  Imaging Studies ordered: I ordered imaging studies including *** I independently visualized and interpreted imaging. I agree with the radiologist interpretation  Additional history obtained from ***.  External records from outside source obtained and reviewed including ***  Cardiac Monitoring: .The patient was maintained on a  cardiac monitor.  I personally viewed and interpreted the cardiac monitored which showed an underlying rhythm of: ***  Reevaluation: After the interventions noted above, I reevaluated the patient and found that they have :{resolved/improved/worsened:23923::"improved"}  Social Determinants of Health: .***  Disposition:  ***  Co morbidities that complicate the patient evaluation . Past Medical History:  Diagnosis Date  . ANA positive   . Aortic valve stenosis   . Asthma   . BPPV (benign paroxysmal positional vertigo)   . Cancer [facility])    breast  . Cataract extraction status of eye, right   . Cervicalgia   . Claw toe   . COPD (chronic obstructive pulmonary disease) ([place])   . Cystitis   . Depression   . Diverticulosis   . Fibromyalgia   . Former smoker   . Frequent falls   . GERD (gastroesophageal reflux disease)   . Hammer toe   . Hearing loss   . Hemangioma   . Hernia, diaphragmatic   .  Hyperopia of both eyes with astigmatism and presbyopia   . Hypertension   . Hypothyroidism   . IBS (irritable bowel syndrome)   . Keratosis, inflamed seborrheic   . Melanoma in situ of left upper arm ([place])   . Mitral valve regurgitation   . MRSA carrier   . Neuropathy   . Nuclear sclerosis of right eye   . Obesity   . Osteoarthritis of both hips   . Osteoarthritis of right shoulder   . Osteoporosis   . Phoria   . PVD (posterior vitreous detachment), both eyes   . Senile osteoporosis   . Spinal stenosis of lumbar region with neurogenic claudication   . Thoracic and lumbosacral neuritis   . Urine incontinence   . Vitamin D deficiency      Medicines No orders of the defined types were placed in this encounter.   I have reviewed the patients home medicines and have made adjustments as needed  Problem List / ED Course: Problem List Items Addressed This Visit   None        {Document critical care time when appropriate:1} {Document review of labs and clinical decision  tools ie heart score, Chads2Vasc2 etc:1}  {Document your independent review of radiology images, and any outside records:1} {Document your discussion with family members, caretakers, and with consultants:1} {Document social determinants of health affecting pt's care:1} {Document your decision making why or why not admission, treatments were needed:1}  This note was created using dictation software, which may contain spelling or grammatical errors.

## 2022-10-10 DIAGNOSIS — K5720 Diverticulitis of large intestine with perforation and abscess without bleeding: Secondary | ICD-10-CM | POA: Diagnosis not present

## 2022-10-10 LAB — BASIC METABOLIC PANEL
Anion gap: 11 (ref 5–15)
Anion gap: 7 (ref 5–15)
BUN: 11 mg/dL (ref 8–23)
BUN: 8 mg/dL (ref 8–23)
CO2: 24 mmol/L (ref 22–32)
CO2: 27 mmol/L (ref 22–32)
Calcium: 8.7 mg/dL — ABNORMAL LOW (ref 8.9–10.3)
Calcium: 8.7 mg/dL — ABNORMAL LOW (ref 8.9–10.3)
Chloride: 104 mmol/L (ref 98–111)
Chloride: 103 mmol/L (ref 98–111)
Creatinine, Ser: 0.61 mg/dL (ref 0.44–1.00)
Creatinine, Ser: 0.64 mg/dL (ref 0.44–1.00)
GFR, Estimated: >60 mL/min (ref >60)
GFR, Estimated: >60 mL/min (ref >60)
Glucose, Bld: 76 mg/dL (ref 70–99)
Glucose, Bld: 99 mg/dL (ref 70–99)
Potassium: 2.7 mmol/L — CL (ref 3.5–5.1)
Potassium: 3.7 mmol/L (ref 3.5–5.1)
Sodium: 139 mmol/L (ref 135–145)
Sodium: 137 mmol/L (ref 135–145)

## 2022-10-10 LAB — CBC
HCT: 38.2 % (ref 36.0–46.0)
Hemoglobin: 12 g/dL (ref 12.0–15.0)
MCH: 26.4 pg (ref 26.0–34.0)
MCHC: 31.4 g/dL (ref 30.0–36.0)
MCV: 84 fL (ref 80.0–100.0)
Platelets: 328 10*3/uL (ref 150–400)
RBC: 4.55 MIL/uL (ref 3.87–5.11)
RDW: 16.2 % — ABNORMAL HIGH (ref 11.5–15.5)
WBC: 8.7 10*3/uL (ref 4.0–10.5)
nRBC: 0 % (ref 0.0–0.2)

## 2022-10-10 LAB — MAGNESIUM: Magnesium: 2 mg/dL (ref 1.7–2.4)

## 2022-10-10 MED ORDER — ORAL CARE MOUTH RINSE: 15.0000 mL | OROMUCOSAL | Status: DC | PRN

## 2022-10-10 MED ORDER — POTASSIUM CHLORIDE 10 MEQ/100ML IV SOLN
10.0000 meq | INTRAVENOUS | Status: AC
  Administered 2022-10-10 (×2): 10 meq via INTRAVENOUS
  Filled 2022-10-10 (×2): qty 100

## 2022-10-10 MED ORDER — LOSARTAN POTASSIUM 50 MG PO TABS
50.0000 mg | ORAL_TABLET | Freq: Every day | ORAL | Status: DC
  Administered 2022-10-10 – 2022-10-12 (×3): 50 mg via ORAL
  Filled 2022-10-10 (×3): qty 1

## 2022-10-10 MED ORDER — MAGNESIUM SULFATE 2 GM/50ML IV SOLN
2.0000 g | Freq: Once | INTRAVENOUS | Status: AC
  Administered 2022-10-10: 2 g via INTRAVENOUS
  Filled 2022-10-10: qty 50

## 2022-10-10 MED ORDER — POTASSIUM CHLORIDE CRYS ER 20 MEQ PO TBCR
40.0000 meq | EXTENDED_RELEASE_TABLET | Freq: Once | ORAL | Status: AC
  Administered 2022-10-10: 40 meq via ORAL
  Filled 2022-10-10: qty 2

## 2022-10-10 MED ORDER — POTASSIUM CHLORIDE 10 MEQ/100ML IV SOLN: 10.0000 meq | INTRAVENOUS | Status: DC

## 2022-10-10 MED ORDER — POTASSIUM CHLORIDE 10 MEQ/100ML IV SOLN
10.0000 meq | INTRAVENOUS | Status: DC
  Administered 2022-10-10: 10 meq via INTRAVENOUS
  Filled 2022-10-10: qty 100

## 2022-10-10 NOTE — Progress Notes (Signed)
PROGRESS NOTE    Darlene Lloyd  [id number] DOB: 09/17/1932 DOA: 10/09/2022 PCP: Charlane Ferretti, MD    Brief Narrative:   Darlene Lloyd is a 89 y.o. female with medical history significant of breast cancer; COPD;  HTN; hypothyroidism; and chronic pain presenting with n/v.   She reports acute onset of n/v.  Emesis has been persistent and is now quite black.  No fever.  No abdominal pain.      Assessment and Plan: Diverticulitis with microperf -Patient is presenting with n/v -IV abx -advance diet  Hypokalemia -replete   Hematemesis -resolved -seen by GI, no need for EGD   Prolonged QTc -Likely associated with dehydration, anticipate resolution once volume status is normalized -Will attempt to avoid QT-prolonging medications such as PPI, nausea meds, SSRIs -prob due to low K   COPD -Uses prn Albuterol -Not on home O2   HTN -resume home meds   Hypothyroidism -Continue Synthroid   Anxiety -Continue escitalopram, alprazolam   DNR -confirmed inER    DVT prophylaxis: SCDs Start: 10/09/22 1722    Code Status: DNR Family Communication:   Disposition Plan:  Level of care: Telemetry Status is: Inpatient Remains inpatient appropriate because: needs IV abx    Consultants:  GI   Subjective: Very hard of hearing-- no complaints  Objective: Vitals:   10/09/22 1831 10/09/22 2213 10/10/22 0225 10/10/22 0626  BP: (!) 170/80 (!) 144/79 (!) 146/75 (!) 160/79  Pulse: 95 88 89 88  Resp: (!) '22 18 18 18  '$ Temp: 98.2 F (36.8 C) 98.3 F (36.8 C) 97.8 F (36.6 C) 98 F (36.7 C)  TempSrc: Oral Oral Oral Oral  SpO2: 100% 98% 97% 98%  Weight:      Height:        Intake/Output Summary (Last 24 hours) at 10/10/2022 1031 Last data filed at 10/10/2022 0500 Gross per 24 hour  Intake 1369.1 ml  Output 675 ml  Net 694.1 ml   Filed Weights   10/09/22 1227 10/09/22 1831  Weight: 77.1 kg 83.5 kg    Examination:   General: Appearance:    Obese female in no  acute distress     Lungs:     Clear to auscultation bilaterally  Heart:    Normal heart rate.    MS:   All extremities are intact.    Neurologic:   Awake, alert, very hard of hearing       Data Reviewed: I have personally reviewed following labs and imaging studies  CBC: Recent Labs  Lab 10/09/22 1327 10/10/22 0422  WBC 9.3 8.7  HGB 14.2 12.0  HCT 45.0 38.2  MCV 80.9 84.0  PLT 407* [id number]   Basic Metabolic Panel: Recent Labs  Lab 10/09/22 1327 10/10/22 0422  NA 139 139  K 2.6* 2.7*  CL 103 104  CO2 19* 24  GLUCOSE 203* 76  BUN 13 11  CREATININE 0.67 0.61  CALCIUM 8.8* 8.7*  MG  --  2.0   GFR: Estimated Creatinine Clearance: 51.8 mL/min (by C-G formula based on SCr of 0.61 mg/dL). Liver Function Tests: Recent Labs  Lab 10/09/22 1327  AST 19  ALT 11  ALKPHOS 71  BILITOT 0.8  PROT 7.1  ALBUMIN 3.9   Recent Labs  Lab 10/09/22 1327  LIPASE 31   No results for input(s): "AMMONIA" in the last 168 hours. Coagulation Profile: No results for input(s): "INR", "PROTIME" in the last 168 hours. Cardiac Enzymes: No results for input(s): "CKTOTAL", "CKMB", "CKMBINDEX", "TROPONINI" in  the last 168 hours. BNP (last 3 results) No results for input(s): "PROBNP" in the last 8760 hours. HbA1C: No results for input(s): "HGBA1C" in the last 72 hours. CBG: No results for input(s): "GLUCAP" in the last 168 hours. Lipid Profile: No results for input(s): "CHOL", "HDL", "LDLCALC", "TRIG", "CHOLHDL", "LDLDIRECT" in the last 72 hours. Thyroid Function Tests: No results for input(s): "TSH", "T4TOTAL", "FREET4", "T3FREE", "THYROIDAB" in the last 72 hours. Anemia Panel: No results for input(s): "VITAMINB12", "FOLATE", "FERRITIN", "TIBC", "IRON", "RETICCTPCT" in the last 72 hours. Sepsis Labs: Recent Labs  Lab 10/09/22 1352  LATICACIDVEN 1.4    No results found for this or any previous visit (from the past 240 hour(s)).       Radiology Studies: CT ABDOMEN PELVIS W  CONTRAST  Result Date: 10/09/2022 CLINICAL DATA:  Acute abdominal pain.  Coffee-ground emesis. EXAM: CT ABDOMEN AND PELVIS WITH CONTRAST TECHNIQUE: Multidetector CT imaging of the abdomen and pelvis was performed using the standard protocol following bolus administration of intravenous contrast. RADIATION DOSE REDUCTION: This exam was performed according to the departmental dose-optimization program which includes automated exposure control, adjustment of the mA and/or kV according to patient size and/or use of iterative reconstruction technique. CONTRAST:  125m OMNIPAQUE IOHEXOL 300 MG/ML  SOLN COMPARISON:  CT abdomen and pelvis 05/02/2022 FINDINGS: Lower chest: No acute abnormality. Hepatobiliary: Calcified granulomas are seen throughout the liver. Gallstones are present. The gallbladder is mildly dilated without surrounding inflammation. There is no biliary ductal dilatation. Pancreas: Unremarkable. No pancreatic ductal dilatation or surrounding inflammatory changes. Spleen: Calcified granulomas are present. Adrenals/Urinary Tract: Bilateral renal cortical scarring is present. There are subcentimeter cortical hypodensities in both kidneys which are too small to characterize and unchanged. There is no hydronephrosis or perinephric fluid. The adrenal glands and bladder are within normal limits. Stomach/Bowel: There is diffuse colonic diverticulosis. There is wall thickening and inflammatory stranding of the mid sigmoid colon compatible with acute diverticulitis. Micro perforation is present. No focal abscess identified. There is no bowel obstruction. Small hiatal hernia is again seen. In the appendix is not visualized. Vascular/Lymphatic: Aortic atherosclerosis. No enlarged abdominal or pelvic lymph nodes. Reproductive: Status post hysterectomy. No adnexal masses. Other: Trace free fluid in the pelvis. Fat containing paraumbilical hernia appears unchanged. Musculoskeletal: The bones are osteopenic. L5  compression deformity is chronic and unchanged. Right hip arthroplasty is present. IMPRESSION: 1. Acute sigmoid colon diverticulitis with micro perforation. No focal abscess. 2. Cholelithiasis. 3. Subcentimeter Bosniak II renal cyst, too small to characterize. No follow-up imaging is recommended. JACR 2018 Feb; 264-273, Management of the Incidental Renal Mass on CT, [employer] 2021; 814-848, Bosniak Classification of Cystic Renal Masses, Version 2019. Aortic Atherosclerosis (ICD10-I70.0). Electronically Signed   By: ARonney AstersM.D.   On: 10/09/2022 16:06        Scheduled Meds:  amLODipine  10 mg Oral Daily   escitalopram  20 mg Oral Daily   levothyroxine  150 mcg Oral QAC breakfast   pantoprazole (PROTONIX) IV  40 mg Intravenous Q12H   sodium chloride flush  3 mL Intravenous Q12H   Continuous Infusions:  lactated ringers 100 mL/hr at 10/10/22 0534   magnesium sulfate bolus IVPB 2 g (10/10/22 0934)   piperacillin-tazobactam (ZOSYN)  IV 3.375 g (10/10/22 0528)   potassium chloride 10 mEq (10/10/22 0925)     LOS: 1 day    Time spent: 45 minutes spent on chart review, discussion with nursing staff, consultants, updating family and interview/physical exam; more than 50% of that  time was spent in counseling and/or coordination of care.    Geradine Girt, DO [facility] Available via Epic secure chat 7am-7pm After these hours, please refer to coverage provider listed on [email or web address] 10/10/2022, 10:31 AM

## 2022-10-10 NOTE — Consult Note (Signed)
Referring Provider: [place] Primary Care Physician:  Charlane Ferretti, MD Primary Gastroenterologist:  Dr. Therisa Doyne  Reason for Consultation: Complicated diverticulitis  HPI: Darlene Lloyd is a 89 y.o. female with past medical history of COPD, hypothyroidism, hypertension, remote history of breast cancer admitted to the hospital with acute onset of nausea and vomiting.  Initial blood work yesterday showed normal CBC, low potassium 2.6 otherwise normal CMP, normal lipase and normal lactic acid.  CT abdomen pelvis with IV contrast showed acute sigmoid colon diverticulitis with microperforation.  No abscess.   Patient had a similar presentation of coffee-ground emesis in October 2023.  Underwent EGD at that time which showed torturous esophagus, erosive gastritis and LA grade esophagitis.  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  Patient seen and examined at bedside.  Patient is very hard of hearing.  She denies any abdominal pain.  Had 1 episode of dark-colored vomiting yesterday.  Denies any blood in the stool. Past Medical History:  Diagnosis Date   ANA positive    Aortic valve stenosis    Asthma    BPPV (benign paroxysmal positional vertigo)    Cancer (HCC)    breast   Cervicalgia    COPD (chronic obstructive pulmonary disease) (HCC)    Depression    Diverticulosis    Fibromyalgia    GERD (gastroesophageal reflux disease)    Hearing loss    Hernia, diaphragmatic    Hyperopia of both eyes with astigmatism and presbyopia    Hypertension    Hypothyroidism    IBS (irritable bowel syndrome)    Melanoma in situ of left upper arm (HCC)    Neuropathy    Nuclear sclerosis of right eye    Obesity    Osteoporosis    Senile osteoporosis    Spinal stenosis of lumbar region with neurogenic claudication    Thoracic and lumbosacral neuritis    Vitamin D deficiency     Past Surgical History:  Procedure Laterality  Date   [place]   CATARACT EXTRACTION  2012   ESOPHAGOGASTRODUODENOSCOPY N/A 05/04/2022   Procedure: ESOPHAGOGASTRODUODENOSCOPY (EGD);  Surgeon: Ronnette Juniper, MD;  Location: [facility];  Service: Gastroenterology;  Laterality: N/A;   ESOPHAGUS SURGERY  1989   hiatal  1989   laminectomy  1983   TOTAL HIP ARTHROPLASTY  2019   TOTAL KNEE ARTHROPLASTY  2001   TOTAL KNEE ARTHROPLASTY  2003    Prior to Admission medications   Medication Sig Start Date End Date Taking? Authorizing Provider  albuterol (VENTOLIN HFA) 108 (90 Base) MCG/ACT inhaler Inhale 2 puffs into the lungs every 4 (four) hours as needed for wheezing or shortness of breath. 10/01/19  Yes [provider]  ALPRAZolam Duanne Moron) 0.25 MG tablet Take 0.25 mg by mouth at bedtime as needed for anxiety.   Yes [provider]  AMLODIPINE BESYLATE PO Take 10 mg by mouth daily.   Yes [provider]  ascorbic acid (VITAMIN C) 500 MG tablet Take 500 mg by mouth daily.   Yes [provider]  celecoxib (CELEBREX) 200 MG capsule Take 200 mg by mouth 2 (two) times daily.   Yes [provider]  Cholecalciferol (VITAMIN D3) 50 MCG (2000 UT) capsule Take 2,000 Units by mouth daily.   Yes [provider]  dicyclomine (BENTYL) 20 MG tablet Take 20 mg by mouth 3 (  three) times daily as needed for spasms.   Yes [provider]  escitalopram (LEXAPRO) 20 MG tablet Take 20 mg by mouth daily. 04/27/22  Yes [provider]  Esomeprazole Magnesium (NEXIUM PO) Take 1 tablet by mouth daily.   Yes [provider]  hyoscyamine (LEVBID) 0.375 MG 12 hr tablet Take 0.375 mg by mouth every 12 (twelve) hours as needed for cramping.   Yes [provider]  levothyroxine (SYNTHROID) 150 MCG tablet Take 150 mcg by mouth daily before breakfast.   Yes [provider]  losartan (COZAAR) 50 MG tablet Take 50 mg by mouth  daily. 03/21/22  Yes [provider]  MAGNESIUM PO Take 250 mg by mouth daily.   Yes [provider]  MULTIPLE VITAMIN PO Take 1 tablet by mouth daily.   Yes [provider]  Potassium 99 MG TABS Take 99 mg by mouth daily.   Yes [provider]  pyridOXINE (VITAMIN B-6) 100 MG tablet Take 100 mg by mouth daily.   Yes [provider]  pantoprazole (PROTONIX) 40 MG tablet Take 1 tablet (40 mg total) by mouth 2 (two) times daily. Patient not taking: Reported on 10/09/2022 05/04/22 06/03/22  Donne Hazel, MD    Scheduled Meds:  amLODipine  10 mg Oral Daily   escitalopram  20 mg Oral Daily   levothyroxine  150 mcg Oral QAC breakfast   pantoprazole (PROTONIX) IV  40 mg Intravenous Q12H   sodium chloride flush  3 mL Intravenous Q12H   Continuous Infusions:  lactated ringers 100 mL/hr at 10/10/22 0534   magnesium sulfate bolus IVPB     piperacillin-tazobactam (ZOSYN)  IV 3.375 g (10/10/22 0528)   potassium chloride     PRN Meds:.acetaminophen **OR** acetaminophen, albuterol, ALPRAZolam, hydrALAZINE, morphine injection, ondansetron **OR** ondansetron (ZOFRAN) IV  Allergies as of 10/09/2022 - Review Complete 10/09/2022  Allergen Reaction Noted   Codeine Other (See Comments) 04/13/2020   Sulfasalazine Other (See Comments) 04/13/2020    Family History  Problem Relation Age of Onset   Hypertension Father     Social History   Socioeconomic History   Marital status: Married    Spouse name: Not on file   Number of children: Not on file   Years of education: Not on file   Highest education level: Not on file  Occupational History   Not on file  Tobacco Use   Smoking status: Former   Smokeless tobacco: Never  Vaping Use   Vaping Use: Never used  Substance and Sexual Activity   Alcohol use: Yes   Drug use: Never   Sexual activity: Not on file  Other Topics Concern   Not on file  Social History Narrative   Not on file   Social  Determinants of Health   Financial Resource Strain: Not on file  Food Insecurity: Not on file  Transportation Needs: Not on file  Physical Activity: Not on file  Stress: Not on file  Social Connections: Not on file  Intimate Partner Violence: Not on file    Review of Systems: All negative except as stated above in HPI.  Physical Exam: Vital signs: Vitals:   10/10/22 0225 10/10/22 0626  BP: (!) 146/75 (!) 160/79  Pulse: 89 88  Resp: 18 18  Temp: 97.8 F (36.6 C) 98 F (36.7 C)  SpO2: 97% 98%   Last BM Date : 10/09/22 General:   elderly patient, very hard of hearing Lungs: No visible respiratory distress Heart:  Regular rate and rhythm; no murmurs, clicks, rubs,  or gallops. Abdomen: Soft, nontender, nondistended, bowel sounds present, no peritoneal signs Rectal:  Deferred  GI:  Lab Results: Recent Labs    10/09/22 1327 10/10/22 0422  WBC 9.3 8.7  HGB 14.2 12.0  HCT 45.0 38.2  PLT 407* 328   BMET Recent Labs    10/09/22 1327 10/10/22 0422  NA 139 139  K 2.6* 2.7*  CL 103 104  CO2 19* 24  GLUCOSE 203* 76  BUN 13 11  CREATININE 0.67 0.61  CALCIUM 8.8* 8.7*   LFT Recent Labs    10/09/22 1327  PROT 7.1  ALBUMIN 3.9  AST 19  ALT 11  ALKPHOS 71  BILITOT 0.8   PT/INR No results for input(s): "LABPROT", "INR" in the last 72 hours.   Studies/Results: CT ABDOMEN PELVIS W CONTRAST  Result Date: 10/09/2022 CLINICAL DATA:  Acute abdominal pain.  Coffee-ground emesis. EXAM: CT ABDOMEN AND PELVIS WITH CONTRAST TECHNIQUE: Multidetector CT imaging of the abdomen and pelvis was performed using the standard protocol following bolus administration of intravenous contrast. RADIATION DOSE REDUCTION: This exam was performed according to the departmental dose-optimization program which includes automated exposure control, adjustment of the mA and/or kV according to patient size and/or use of iterative reconstruction technique. CONTRAST:  155m OMNIPAQUE IOHEXOL 300  MG/ML  SOLN COMPARISON:  CT abdomen and pelvis 05/02/2022 FINDINGS: Lower chest: No acute abnormality. Hepatobiliary: Calcified granulomas are seen throughout the liver. Gallstones are present. The gallbladder is mildly dilated without surrounding inflammation. There is no biliary ductal dilatation. Pancreas: Unremarkable. No pancreatic ductal dilatation or surrounding inflammatory changes. Spleen: Calcified granulomas are present. Adrenals/Urinary Tract: Bilateral renal cortical scarring is present. There are subcentimeter cortical hypodensities in both kidneys which are too small to characterize and unchanged. There is no hydronephrosis or perinephric fluid. The adrenal glands and bladder are within normal limits. Stomach/Bowel: There is diffuse colonic diverticulosis. There is wall thickening and inflammatory stranding of the mid sigmoid colon compatible with acute diverticulitis. Micro perforation is present. No focal abscess identified. There is no bowel obstruction. Small hiatal hernia is again seen. In the appendix is not visualized. Vascular/Lymphatic: Aortic atherosclerosis. No enlarged abdominal or pelvic lymph nodes. Reproductive: Status post hysterectomy. No adnexal masses. Other: Trace free fluid in the pelvis. Fat containing paraumbilical hernia appears unchanged. Musculoskeletal: The bones are osteopenic. L5 compression deformity is chronic and unchanged. Right hip arthroplasty is present. IMPRESSION: 1. Acute sigmoid colon diverticulitis with micro perforation. No focal abscess. 2. Cholelithiasis. 3. Subcentimeter Bosniak II renal cyst, too small to characterize. No follow-up imaging is recommended. JACR 2018 Feb; 264-273, Management of the Incidental Renal Mass on CT, [employer] 2021; 814-848, Bosniak Classification of Cystic Renal Masses, Version 2019. Aortic Atherosclerosis (ICD10-I70.0). Electronically Signed   By: ARonney AstersM.D.   On: 10/09/2022 16:06    Impression/Plan: -Nausea and  vomiting.  CT scan showed acute sigmoid diverticulitis with microperforation.  No abscess. -Dark-colored vomiting.  Patient also presented with coffee-ground emesis in October 2023.  EGD at that time showed erosive gastritis and LA grade a esophagitis.  Recommendations -------------------------- -Continue IV antibiotics for today.  Start full liquid diet and slowly advance as tolerated.  Recommend high-fiber diet moving forward. -Continue IV twice daily PPI for now.  Do not think repeat EGD is needed at this time. -Avoid NSAIDs -GI will follow.  Hopefully discharge tomorrow if able to tolerate diet.    LOS: 1 day   Amanat Hackel  MD, [place] 10/10/2022, 9:14 AM  Contact #  [phone]

## 2022-10-10 NOTE — Plan of Care (Signed)
  Problem: Education: Goal: Knowledge of General Education information will improve Description: Including pain rating scale, medication(s)/side effects and non-pharmacologic comfort measures Outcome: Adequate for Discharge   

## 2022-10-11 DIAGNOSIS — K5720 Diverticulitis of large intestine with perforation and abscess without bleeding: Secondary | ICD-10-CM | POA: Diagnosis not present

## 2022-10-11 LAB — CBC
HCT: 38 % (ref 36.0–46.0)
Hemoglobin: 11.9 g/dL — ABNORMAL LOW (ref 12.0–15.0)
MCH: 25.9 pg — ABNORMAL LOW (ref 26.0–34.0)
MCHC: 31.3 g/dL (ref 30.0–36.0)
MCV: 82.8 fL (ref 80.0–100.0)
Platelets: 265 10*3/uL (ref 150–400)
RBC: 4.59 MIL/uL (ref 3.87–5.11)
RDW: 16.2 % — ABNORMAL HIGH (ref 11.5–15.5)
WBC: 7.2 10*3/uL (ref 4.0–10.5)
nRBC: 0 % (ref 0.0–0.2)

## 2022-10-11 LAB — BASIC METABOLIC PANEL
Anion gap: 12 (ref 5–15)
BUN: 7 mg/dL — ABNORMAL LOW (ref 8–23)
CO2: 26 mmol/L (ref 22–32)
Calcium: 8.6 mg/dL — ABNORMAL LOW (ref 8.9–10.3)
Chloride: 101 mmol/L (ref 98–111)
Creatinine, Ser: 0.55 mg/dL (ref 0.44–1.00)
GFR, Estimated: >60 mL/min (ref >60)
Glucose, Bld: 82 mg/dL (ref 70–99)
Potassium: 3 mmol/L — ABNORMAL LOW (ref 3.5–5.1)
Sodium: 139 mmol/L (ref 135–145)

## 2022-10-11 MED ORDER — POTASSIUM CHLORIDE CRYS ER 20 MEQ PO TBCR
40.0000 meq | EXTENDED_RELEASE_TABLET | Freq: Every day | ORAL | Status: DC
  Administered 2022-10-11 – 2022-10-12 (×2): 40 meq via ORAL
  Filled 2022-10-11 (×2): qty 2

## 2022-10-11 MED ORDER — POTASSIUM CHLORIDE CRYS ER 20 MEQ PO TBCR
40.0000 meq | EXTENDED_RELEASE_TABLET | Freq: Once | ORAL | Status: AC
  Administered 2022-10-11: 40 meq via ORAL
  Filled 2022-10-11: qty 2

## 2022-10-11 MED ORDER — AMOXICILLIN-POT CLAVULANATE 875-125 MG PO TABS
1.0000 | ORAL_TABLET | Freq: Two times a day (BID) | ORAL | Status: DC
  Administered 2022-10-11 – 2022-10-12 (×3): 1 via ORAL
  Filled 2022-10-11 (×3): qty 1

## 2022-10-11 NOTE — Progress Notes (Signed)
[facility] Gastroenterology Progress Note  Darlene Lloyd 89 y.o. [birth year]  CC: Complicated diverticulitis   Subjective: Patient seen and examined at bedside.  Hard of hearing but denies any acute issues.  Denies any further vomiting.  Tolerating diet.  ROS : afebrile, negative for chest pain, hard of hearing   Objective: Vital signs in last 24 hours: Vitals:   10/10/22 2037 10/11/22 0528  BP: 132/81 (!) 146/69  Pulse: 84 96  Resp:  17  Temp: 98.8 F (37.1 C) 98.4 F (36.9 C)  SpO2: 98% 97%    Physical Exam:  General:   elderly patient, very hard of hearing Lungs: No visible respiratory distress Heart:  Regular rate and rhythm; no murmurs, clicks, rubs,  or gallops. Abdomen: Soft, nontender, nondistended, bowel sounds present, no peritoneal signs   Lab Results: Recent Labs    10/10/22 0422 10/10/22 1523 10/11/22 0448  NA 139 137 139  K 2.7* 3.7 3.0*  CL 104 103 101  CO2 '24 27 26  '$ GLUCOSE 76 99 82  BUN 11 8 7*  CREATININE 0.61 0.64 0.55  CALCIUM 8.7* 8.7* 8.6*  MG 2.0  --   --    Recent Labs    10/09/22 1327  AST 19  ALT 11  ALKPHOS 71  BILITOT 0.8  PROT 7.1  ALBUMIN 3.9   Recent Labs    10/10/22 0422 10/11/22 0448  WBC 8.7 7.2  HGB 12.0 11.9*  HCT 38.2 38.0  MCV 84.0 82.8  PLT 328 265   No results for input(s): "LABPROT", "INR" in the last 72 hours.    Assessment/Plan: -Nausea and vomiting.  CT scan showed acute sigmoid diverticulitis with microperforation.  No abscess. -Dark-colored vomiting.  Patient also presented with coffee-ground emesis in October 2023.  EGD at that time showed erosive gastritis and LA grade a esophagitis.   Recommendations -------------------------- -Tolerating diet.  Denies any further vomiting. -Okay to discharge home from GI standpoint with 14 days of antibiotics. -Recommend Protonix 40 mg twice a day for 4 weeks followed by Protonix 40 mg once a day. -Follow-up with Dr. Therisa Doyne in 2 months after discharge.  Avoid  NSAIDs.  High-fiber diet.  GI will sign off.  Call us back if needed.   Otis Brace MD, [place] 10/11/2022, 12:03 PM  Contact #  [phone]

## 2022-10-11 NOTE — Evaluation (Signed)
Physical Therapy Evaluation Patient Details Name: Darlene Lloyd MRN: [id number] DOB: Nov 09, 1932 Today's Date: 10/11/2022  History of Present Illness  89 yo female presents to therapy s/p hospital admission on 3/10 secondary to abdominal pain, N and V with emesis x 2 days. Abdominal CT revealed diverticulosis of colon without abscess. Pt started on IV ABX.  Pt has memory deficits at baseline and currently resides with husband at [facility] in [place]. Pt has pmh including but not limited to: aortic valve stenosis, BPPV, BaCa, COPD, fibromyalgia, HOH, IBS, spinal stenosis, THA, and B TKA.  Clinical Impression    Pt admitted secondary to problem above with deficits below. PLOF patient required some S and A from husband and amb short distances in home with RW however wc primary means of mobility.1 Pt currently requires min guard for bed mobility, min guard with cues for transfer tasks and min guard for gait tasks 12 feet with RW and cues, pt expresses fear of falling. Pt left seated in recliner, all needs met and NT aware.  Anticipate patient will benefit from PT to address problems listed below.Will continue to follow acutely to maximize functional mobility independence and safety.        Recommendations for follow up therapy are one component of a multi-disciplinary discharge planning process, led by the attending physician.  Recommendations may be updated based on patient status, additional functional criteria and insurance authorization.  Follow Up Recommendations Home health PT      Assistance Recommended at Discharge Frequent or constant Supervision/Assistance  Patient can return home with the following  A little help with walking and/or transfers;A little help with bathing/dressing/bathroom;Assistance with cooking/housework;Direct supervision/assist for medications management;Direct supervision/assist for financial management;Assist for transportation;Help with stairs or ramp for entrance     Equipment Recommendations  (husband reports having all DME in home setting)  Recommendations for Other Services       Functional Status Assessment Patient has had a recent decline in their functional status and demonstrates the ability to make significant improvements in function in a reasonable and predictable amount of time.     Precautions / Restrictions Precautions Precautions: Fall (HOH has B hearing aids) Restrictions Weight Bearing Restrictions: No      Mobility  Bed Mobility Overal bed mobility: Needs Assistance Bed Mobility: Supine to Sit     Supine to sit: Min guard     General bed mobility comments: HOB elvated and use of bed rail    Transfers Overall transfer level: Needs assistance   Transfers: Sit to/from Stand Sit to Stand: Min guard           General transfer comment: cues for proper UE and AD placement    Ambulation/Gait Ambulation/Gait assistance: Min guard Gait Distance (Feet): 12 Feet Assistive device: Rolling walker (2 wheels) Gait Pattern/deviations: Step-to pattern, Trunk flexed, Wide base of support Gait velocity: decreased     General Gait Details: pt expressed fear of falling  Stairs            Wheelchair Mobility    Modified Rankin (Stroke Patients Only)       Balance Overall balance assessment: Needs assistance Sitting-balance support: Feet supported Sitting balance-Leahy Scale: Fair     Standing balance support: Reliant on assistive device for balance, During functional activity Standing balance-Leahy Scale: Poor                               Pertinent Vitals/Pain Pain Assessment  Pain Assessment: No/denies pain (pt did not indicate pain behaviors and when asked spcifically about abdominal pain pt indiated she did not have any)    Home Living Family/patient expects to be discharged to:: Other (Comment) (ILF home at [facility]) Living Arrangements: Spouse/significant other                       Prior Function Prior Level of Function : Needs assist  Cognitive Assist : Mobility (cognitive);ADLs (cognitive) Mobility (Cognitive): Set up cues ADLs (Cognitive): Set up cues Physical Assist : Mobility (physical) Mobility (physical): Gait   Mobility Comments: husband present to provide insight to PLOF. pt living with husband in level entry home at [facility], husband reports pt was amb short distances wtih RW but primarily used wc for mobility, pt was participating with PT at [place]. Pt required intermittent S and A for functional mobility tasks and ADLs. pt has memory deficts pt and husband indicated are at baseline.       Hand Dominance        Extremity/Trunk Assessment        Lower Extremity Assessment Lower Extremity Assessment: Overall WFL for tasks assessed       Communication   Communication: HOH  Cognition Arousal/Alertness: Awake/alert Behavior During Therapy: WFL for tasks assessed/performed Overall Cognitive Status: History of cognitive impairments - at baseline                                          General Comments      Exercises     Assessment/Plan    PT Assessment Patient needs continued PT services  PT Problem List Decreased strength;Decreased activity tolerance;Decreased balance;Decreased mobility;Decreased coordination;Decreased cognition;Decreased knowledge of use of DME;Pain       PT Treatment Interventions DME instruction;Gait training;Functional mobility training;Therapeutic activities;Therapeutic exercise;Balance training;Neuromuscular re-education;Patient/family education    PT Goals (Current goals can be found in the Care Plan section)  Acute Rehab PT Goals Patient Stated Goal: to go home and be more moble to limit use of WC PT Goal Formulation: With patient/family Time For Goal Achievement: 10/25/22    Frequency Min 3X/week     Co-evaluation               AM-PAC PT "6 Clicks" Mobility  Outcome  Measure Help needed turning from your back to your side while in a flat bed without using bedrails?: A Little Help needed moving from lying on your back to sitting on the side of a flat bed without using bedrails?: A Little Help needed moving to and from a bed to a chair (including a wheelchair)?: A Little Help needed standing up from a chair using your arms (e.g., wheelchair or bedside chair)?: A Little Help needed to walk in hospital room?: A Little Help needed climbing 3-5 steps with a railing? : Total 6 Click Score: 16    End of Session Equipment Utilized During Treatment: Gait belt Activity Tolerance: Patient tolerated treatment well;No increased pain Patient left: in chair;with call bell/phone within reach;with chair alarm set Nurse Communication: Mobility status PT Visit Diagnosis: Unsteadiness on feet (R26.81);Muscle weakness (generalized) (M62.81);Difficulty in walking, not elsewhere classified (R26.2)    Time: 1137-1208 PT Time Calculation (min) (ACUTE ONLY): 31 min   Charges:   PT Evaluation $PT Eval Low Complexity: 1 Low PT Treatments $Therapeutic Activity: 8-22 mins  Baird Lyons, PT   Adair Patter 10/11/2022, 4:11 PM

## 2022-10-11 NOTE — Progress Notes (Signed)
PROGRESS NOTE    Darlene Lloyd  [id number] DOB: 1932-11-08 DOA: 10/09/2022 PCP: Charlane Ferretti, MD    Brief Narrative:   Darlene Lloyd is a 89 y.o. female with medical history significant of breast cancer; COPD;  HTN; hypothyroidism; and chronic pain presenting with n/v.   She reports acute onset of n/v.  Emesis has been persistent and is now quite black.  No fever.  No abdominal pain.    Diverticulitis with microperf, hypokalemia.    Assessment and Plan: Diverticulitis with microperf -Patient is presenting with n/v -IV abx-- change to PO to see if tolerates -advance diet-- encourage POintake   Hypokalemia -replete -probably needs daily dose   Hematemesis -resolved -seen by GI, no need for EGD- signed off   Prolonged QTc -Likely associated with dehydration, anticipate resolution once volume status is normalized -Will attempt to avoid QT-prolonging medications such as PPI, nausea meds, SSRIs -prob due to low K   COPD -Uses prn Albuterol -Not on home O2   HTN -resume home meds   Hypothyroidism -Continue Synthroid   Anxiety -Continue escitalopram, alprazolam   DNR -confirmed inER  Obesity Estimated body mass index is 31.6 kg/m as calculated from the following:   Height as of this encounter: '5\' 4"'$  (1.626 m).   Weight as of this encounter: 83.5 kg.   PT eval pending-- from ILF?   DVT prophylaxis: SCDs Start: 10/09/22 1722    Code Status: DNR   Disposition Plan:  Level of care: Med-Surg Status is: Inpatient Remains inpatient appropriate because: home in AM if eating better and seen by PT    Consultants:  GI   Subjective: For lunch only had 2 bites of toast and 60 ml of tea Denies nausea  Objective: Vitals:   10/10/22 1321 10/10/22 2037 10/11/22 0528 10/11/22 1310  BP: (!) 141/80 132/81 (!) 146/69 121/78  Pulse: 93 84 96 82  Resp: '17  17 18  '$ Temp: 98.6 F (37 C) 98.8 F (37.1 C) 98.4 F (36.9 C) 98.1 F (36.7 C)  TempSrc: Oral Oral  Oral Oral  SpO2: 98% 98% 97% 98%  Weight:      Height:        Intake/Output Summary (Last 24 hours) at 10/11/2022 1324 Last data filed at 10/11/2022 1035 Gross per 24 hour  Intake 3276.93 ml  Output 2150 ml  Net 1126.93 ml   Filed Weights   10/09/22 1227 10/09/22 1831  Weight: 77.1 kg 83.5 kg    Examination:   General: Appearance:    Obese female in no acute distress     Lungs:      respirations unlabored  Heart:    Normal heart rate.    MS:   All extremities are intact.    Neurologic:   Awake, alert-- hard of hearing       Data Reviewed: I have personally reviewed following labs and imaging studies  CBC: Recent Labs  Lab 10/09/22 1327 10/10/22 0422 10/11/22 0448  WBC 9.3 8.7 7.2  HGB 14.2 12.0 11.9*  HCT 45.0 38.2 38.0  MCV 80.9 84.0 82.8  PLT 407* 328 [id number]   Basic Metabolic Panel: Recent Labs  Lab 10/09/22 1327 10/10/22 0422 10/10/22 1523 10/11/22 0448  NA 139 139 137 139  K 2.6* 2.7* 3.7 3.0*  CL 103 104 103 101  CO2 19* '24 27 26  '$ GLUCOSE 203* 76 99 82  BUN '13 11 8 '$ 7*  CREATININE 0.67 0.61 0.64 0.55  CALCIUM 8.8* 8.7* 8.7* 8.6*  MG  --  2.0  --   --    GFR: Estimated Creatinine Clearance: 51.8 mL/min (by C-G formula based on SCr of 0.55 mg/dL). Liver Function Tests: Recent Labs  Lab 10/09/22 1327  AST 19  ALT 11  ALKPHOS 71  BILITOT 0.8  PROT 7.1  ALBUMIN 3.9   Recent Labs  Lab 10/09/22 1327  LIPASE 31   No results for input(s): "AMMONIA" in the last 168 hours. Coagulation Profile: No results for input(s): "INR", "PROTIME" in the last 168 hours. Cardiac Enzymes: No results for input(s): "CKTOTAL", "CKMB", "CKMBINDEX", "TROPONINI" in the last 168 hours. BNP (last 3 results) No results for input(s): "PROBNP" in the last 8760 hours. HbA1C: No results for input(s): "HGBA1C" in the last 72 hours. CBG: No results for input(s): "GLUCAP" in the last 168 hours. Lipid Profile: No results for input(s): "CHOL", "HDL", "LDLCALC",  "TRIG", "CHOLHDL", "LDLDIRECT" in the last 72 hours. Thyroid Function Tests: No results for input(s): "TSH", "T4TOTAL", "FREET4", "T3FREE", "THYROIDAB" in the last 72 hours. Anemia Panel: No results for input(s): "VITAMINB12", "FOLATE", "FERRITIN", "TIBC", "IRON", "RETICCTPCT" in the last 72 hours. Sepsis Labs: Recent Labs  Lab 10/09/22 1352  LATICACIDVEN 1.4    No results found for this or any previous visit (from the past 240 hour(s)).       Radiology Studies: CT ABDOMEN PELVIS W CONTRAST  Result Date: 10/09/2022 CLINICAL DATA:  Acute abdominal pain.  Coffee-ground emesis. EXAM: CT ABDOMEN AND PELVIS WITH CONTRAST TECHNIQUE: Multidetector CT imaging of the abdomen and pelvis was performed using the standard protocol following bolus administration of intravenous contrast. RADIATION DOSE REDUCTION: This exam was performed according to the departmental dose-optimization program which includes automated exposure control, adjustment of the mA and/or kV according to patient size and/or use of iterative reconstruction technique. CONTRAST:  192m OMNIPAQUE IOHEXOL 300 MG/ML  SOLN COMPARISON:  CT abdomen and pelvis 05/02/2022 FINDINGS: Lower chest: No acute abnormality. Hepatobiliary: Calcified granulomas are seen throughout the liver. Gallstones are present. The gallbladder is mildly dilated without surrounding inflammation. There is no biliary ductal dilatation. Pancreas: Unremarkable. No pancreatic ductal dilatation or surrounding inflammatory changes. Spleen: Calcified granulomas are present. Adrenals/Urinary Tract: Bilateral renal cortical scarring is present. There are subcentimeter cortical hypodensities in both kidneys which are too small to characterize and unchanged. There is no hydronephrosis or perinephric fluid. The adrenal glands and bladder are within normal limits. Stomach/Bowel: There is diffuse colonic diverticulosis. There is wall thickening and inflammatory stranding of the mid  sigmoid colon compatible with acute diverticulitis. Micro perforation is present. No focal abscess identified. There is no bowel obstruction. Small hiatal hernia is again seen. In the appendix is not visualized. Vascular/Lymphatic: Aortic atherosclerosis. No enlarged abdominal or pelvic lymph nodes. Reproductive: Status post hysterectomy. No adnexal masses. Other: Trace free fluid in the pelvis. Fat containing paraumbilical hernia appears unchanged. Musculoskeletal: The bones are osteopenic. L5 compression deformity is chronic and unchanged. Right hip arthroplasty is present. IMPRESSION: 1. Acute sigmoid colon diverticulitis with micro perforation. No focal abscess. 2. Cholelithiasis. 3. Subcentimeter Bosniak II renal cyst, too small to characterize. No follow-up imaging is recommended. JACR 2018 Feb; 264-273, Management of the Incidental Renal Mass on CT, [employer] 2021; 814-848, Bosniak Classification of Cystic Renal Masses, Version 2019. Aortic Atherosclerosis (ICD10-I70.0). Electronically Signed   By: ARonney AstersM.D.   On: 10/09/2022 16:06        Scheduled Meds:  amLODipine  10 mg Oral Daily   amoxicillin-clavulanate  1 tablet Oral Q12H  escitalopram  20 mg Oral Daily   levothyroxine  150 mcg Oral QAC breakfast   losartan  50 mg Oral Daily   pantoprazole (PROTONIX) IV  40 mg Intravenous Q12H   potassium chloride  40 mEq Oral Daily   sodium chloride flush  3 mL Intravenous Q12H   Continuous Infusions:   LOS: 2 days    Time spent: 45 minutes spent on chart review, discussion with nursing staff, consultants, updating family and interview/physical exam; more than 50% of that time was spent in counseling and/or coordination of care.    Geradine Girt, DO [facility] Available via Epic secure chat 7am-7pm After these hours, please refer to coverage provider listed on [email or web address] 10/11/2022, 1:24 PM

## 2022-10-12 LAB — BASIC METABOLIC PANEL
Anion gap: 7 (ref 5–15)
BUN: 6 mg/dL — ABNORMAL LOW (ref 8–23)
CO2: 29 mmol/L (ref 22–32)
Calcium: 8.8 mg/dL — ABNORMAL LOW (ref 8.9–10.3)
Chloride: 103 mmol/L (ref 98–111)
Creatinine, Ser: 0.42 mg/dL — ABNORMAL LOW (ref 0.44–1.00)
GFR, Estimated: >60 mL/min (ref >60)
Glucose, Bld: 109 mg/dL — ABNORMAL HIGH (ref 70–99)
Potassium: 3.5 mmol/L (ref 3.5–5.1)
Sodium: 139 mmol/L (ref 135–145)

## 2022-10-12 LAB — MAGNESIUM: Magnesium: 2.2 mg/dL (ref 1.7–2.4)

## 2022-10-12 MED ORDER — GUAIFENESIN 100 MG/5ML PO LIQD: 5.0000 mL | ORAL | Status: DC | PRN

## 2022-10-12 MED ORDER — AMOXICILLIN-POT CLAVULANATE 875-125 MG PO TABS: 1.0000 | ORAL_TABLET | Freq: Two times a day (BID) | ORAL | 0 refills | Status: AC

## 2022-10-12 MED ORDER — SENNOSIDES-DOCUSATE SODIUM 8.6-50 MG PO TABS: 1.0000 | ORAL_TABLET | Freq: Every evening | ORAL | Status: DC | PRN

## 2022-10-12 MED ORDER — METOPROLOL TARTRATE 5 MG/5ML IV SOLN: 5.0000 mg | INTRAVENOUS | Status: DC | PRN

## 2022-10-12 MED ORDER — PANTOPRAZOLE SODIUM 40 MG PO TBEC: 40.0000 mg | DELAYED_RELEASE_TABLET | Freq: Two times a day (BID) | ORAL | 0 refills | Status: AC

## 2022-10-12 MED ORDER — IPRATROPIUM-ALBUTEROL 0.5-2.5 (3) MG/3ML IN SOLN: 3.0000 mL | RESPIRATORY_TRACT | Status: DC | PRN

## 2022-10-12 MED ORDER — TRAZODONE HCL 50 MG PO TABS: 50.0000 mg | ORAL_TABLET | Freq: Every evening | ORAL | Status: DC | PRN

## 2022-10-12 NOTE — TOC Transition Note (Signed)
Transition of Care [facility]) - CM/SW Discharge Note   Patient Details  Name: Darlene Lloyd MRN: [id number] Date of Birth: [birth year]  Transition of Care [facility] CM/SW Contact:  Dessa Phi, RN Phone Number: 10/12/2022, 11:41 AM   Clinical Narrative:  Returning back to [facility]. No further CM needs.     Final next level of care: Home w Home Health Services Barriers to Discharge: No Barriers Identified   Patient Goals and CMS Choice CMS Medicare.gov Compare Post Acute Care list provided to:: Patient Choice offered to / list presented to : Patient  Discharge Placement                         Discharge Plan and Services Additional resources added to the After Visit Summary for     Discharge Planning Services: CM Consult Post Acute Care Choice: Home Health                    HH Arranged: PT [facility] Agency: Other - See comment Date HH Agency Contacted: 10/12/22 Time Tripp: 1132 Representative spoke with at [place]:  Camera operator)  Social Determinants of Health ([place]) Interventions SDOH Screenings   Food Insecurity: No Food Insecurity (10/10/2022)  Housing: Low Risk  (10/10/2022)  Transportation Needs: No Transportation Needs (10/10/2022)  Utilities: Not At Risk (10/10/2022)  Tobacco Use: Medium Risk (10/09/2022)     Readmission Risk Interventions     No data to display

## 2022-10-12 NOTE — Plan of Care (Signed)

## 2022-10-12 NOTE — Progress Notes (Signed)
Pt discharged home today per Dr Reesa Chew. Pt's IV site D/C'd and WDL. Pt's VSS. Pt's husband provided with home medication list, discharge instructions and prescriptions. Verbalized understanding. Pt left floor via WC in stable condition accompanied by NT.

## 2022-10-12 NOTE — Discharge Summary (Signed)
Physician Discharge Summary  Kinzlie Perrault [id number] DOB: 07-18-1933 DOA: 10/09/2022  PCP: Charlane Ferretti, MD  Admit date: 10/09/2022 Discharge date: 10/12/2022  Admitted From: Home Disposition: Home with home health  Recommendations for Outpatient Follow-up:  Follow up with PCP in 1-2 weeks Please obtain BMP/CBC in one week your next doctors visit.  Oral Augmentin for 10 more days to complete total 14-day course PPI twice daily for 4 weeks followed by daily.  30-day prescription given Outpatient follow-up GI to be arranged by their service  Home Health: PT Equipment/Devices: Discharge Condition: Stable CODE STATUS: DNR Diet recommendation: Regular  Brief/Interim Summary: 89 y.o. female with medical history significant of breast cancer; COPD;  HTN; hypothyroidism; and chronic pain presenting with n/v.   She reports acute onset of n/v.  Emesis has been persistent and is now quite black.  No fever.  No abdominal pain.    Diverticulitis with microperf, hypokalemia.  CT of the abdomen showed acute sigmoid diverticulitis with microperforation and no abscess.  Slowly her diet was advanced and she was started on IV antibiotics.  Eventually she was transitioned to p.o.  GI recommended 14 days of antibiotics along with PPI twice daily for 4 weeks followed by daily. Today patient is doing medically well and wants to go home.  She is tolerating orals without any issues.  Remains afebrile.    Discharge Diagnoses:  Principal Problem:   Diverticulitis of colon with perforation Active Problems:   HTN (hypertension)   Hematemesis   COPD (chronic obstructive pulmonary disease) (HCC)   DM2 (diabetes mellitus, type 2) ([place])   Hypothyroidism   DNR (do not resuscitate)      Consultations: [facility]  Subjective: Patient doing well no complaints.  Wants to go home. Hard of hearing without her hearing aid which she is charging during my visit  Discharge Exam: Vitals:    10/12/22 0508 10/12/22 0710  BP: (!) 169/80 (!) 159/75  Pulse: 76 76  Resp: 19   Temp: 97.6 F (36.4 C)   SpO2: 99%    Vitals:   10/11/22 1310 10/11/22 2045 10/12/22 0508 10/12/22 0710  BP: 121/78 129/67 (!) 169/80 (!) 159/75  Pulse: 82 79 76 76  Resp: '18 19 19   '$ Temp: 98.1 F (36.7 C) 97.8 F (36.6 C) 97.6 F (36.4 C)   TempSrc: Oral Oral Oral   SpO2: 98% 96% 99%   Weight:      Height:        General: Pt is alert, awake, not in acute distress Cardiovascular: RRR, S1/S2 +, no rubs, no gallops Respiratory: CTA bilaterally, no wheezing, no rhonchi Abdominal: Soft, NT, ND, bowel sounds + Extremities: no edema, no cyanosis  Discharge Instructions   Allergies as of 10/12/2022       Reactions   Codeine Other (See Comments)   UNK   Sulfasalazine Other (See Comments)   UNK reaction        Medication List     STOP taking these medications    NEXIUM PO       TAKE these medications    albuterol 108 (90 Base) MCG/ACT inhaler Commonly known as: VENTOLIN HFA Inhale 2 puffs into the lungs every 4 (four) hours as needed for wheezing or shortness of breath.   ALPRAZolam 0.25 MG tablet Commonly known as: XANAX Take 0.25 mg by mouth at bedtime as needed for anxiety.   AMLODIPINE BESYLATE PO Take 10 mg by mouth daily.   amoxicillin-clavulanate 875-125 MG tablet Commonly  known as: AUGMENTIN Take 1 tablet by mouth every 12 (twelve) hours for 10 days.   ascorbic acid 500 MG tablet Commonly known as: VITAMIN C Take 500 mg by mouth daily.   celecoxib 200 MG capsule Commonly known as: CELEBREX Take 200 mg by mouth 2 (two) times daily.   dicyclomine 20 MG tablet Commonly known as: BENTYL Take 20 mg by mouth 3 (three) times daily as needed for spasms.   escitalopram 20 MG tablet Commonly known as: LEXAPRO Take 20 mg by mouth daily.   hyoscyamine 0.375 MG 12 hr tablet Commonly known as: LEVBID Take 0.375 mg by mouth every 12 (twelve) hours as needed for  cramping.   levothyroxine 150 MCG tablet Commonly known as: SYNTHROID Take 150 mcg by mouth daily before breakfast.   losartan 50 MG tablet Commonly known as: COZAAR Take 50 mg by mouth daily.   MAGNESIUM PO Take 250 mg by mouth daily.   MULTIPLE VITAMIN PO Take 1 tablet by mouth daily.   pantoprazole 40 MG tablet Commonly known as: Protonix Take 1 tablet (40 mg total) by mouth 2 (two) times daily before a meal. What changed: when to take this   Potassium 99 MG Tabs Take 99 mg by mouth daily.   pyridOXINE 100 MG tablet Commonly known as: VITAMIN B6 Take 100 mg by mouth daily.   Vitamin D3 50 MCG (2000 UT) Caps Generic drug: Cholecalciferol Take 2,000 Units by mouth daily.        Follow-up Information     Charlane Ferretti, MD Follow up in 1 week(s).   Specialty: Internal Medicine Contact information: [address] [place]  [address] [phone]         [facility] Follow up.   Why: [place] physical therapy;tel#[phone].               Allergies  Allergen Reactions   Codeine Other (See Comments)    UNK   Sulfasalazine Other (See Comments)    UNK reaction    You were cared for by a hospitalist during your hospital stay. If you have any questions about your discharge medications or the care you received while you were in the hospital after you are discharged, you can call the unit and asked to speak with the hospitalist on call if the hospitalist that took care of you is not available. Once you are discharged, your primary care physician will handle any further medical issues. Please note that no refills for any discharge medications will be authorized once you are discharged, as it is imperative that you return to your primary care physician (or establish a relationship with a primary care physician if you do not have one) for your aftercare needs so that they can reassess your need for medications and monitor your lab  values.  You were cared for by a hospitalist during your hospital stay. If you have any questions about your discharge medications or the care you received while you were in the hospital after you are discharged, you can call the unit and asked to speak with the hospitalist on call if the hospitalist that took care of you is not available. Once you are discharged, your primary care physician will handle any further medical issues. Please note that NO REFILLS for any discharge medications will be authorized once you are discharged, as it is imperative that you return to your primary care physician (or establish a relationship with a primary  care physician if you do not have one) for your aftercare needs so that they can reassess your need for medications and monitor your lab values.  Please request your Prim.MD to go over all Hospital Tests and Procedure/Radiological results at the follow up, please get all Hospital records sent to your Prim MD by signing hospital release before you go home.  Get CBC, CMP, 2 view Chest X ray checked  by Primary MD during your next visit or SNF MD in 5-7 days ( we routinely change or add medications that can affect your baseline labs and fluid status, therefore we recommend that you get the mentioned basic workup next visit with your PCP, your PCP may decide not to get them or add new tests based on their clinical decision)  On your next visit with your primary care physician please Get Medicines reviewed and adjusted.  If you experience worsening of your admission symptoms, develop shortness of breath, life threatening emergency, suicidal or homicidal thoughts you must seek medical attention immediately by calling 911 or calling your MD immediately  if symptoms less severe.  You Must read complete instructions/literature along with all the possible adverse reactions/side effects for all the Medicines you take and that have been prescribed to you. Take any new Medicines  after you have completely understood and accpet all the possible adverse reactions/side effects.   Do not drive, operate heavy machinery, perform activities at heights, swimming or participation in water activities or provide baby sitting services if your were admitted for syncope or siezures until you have seen by Primary MD or a Neurologist and advised to do so again.  Do not drive when taking Pain medications.   Procedures/Studies: CT ABDOMEN PELVIS W CONTRAST  Result Date: 10/09/2022 CLINICAL DATA:  Acute abdominal pain.  Coffee-ground emesis. EXAM: CT ABDOMEN AND PELVIS WITH CONTRAST TECHNIQUE: Multidetector CT imaging of the abdomen and pelvis was performed using the standard protocol following bolus administration of intravenous contrast. RADIATION DOSE REDUCTION: This exam was performed according to the departmental dose-optimization program which includes automated exposure control, adjustment of the mA and/or kV according to patient size and/or use of iterative reconstruction technique. CONTRAST:  13m OMNIPAQUE IOHEXOL 300 MG/ML  SOLN COMPARISON:  CT abdomen and pelvis 05/02/2022 FINDINGS: Lower chest: No acute abnormality. Hepatobiliary: Calcified granulomas are seen throughout the liver. Gallstones are present. The gallbladder is mildly dilated without surrounding inflammation. There is no biliary ductal dilatation. Pancreas: Unremarkable. No pancreatic ductal dilatation or surrounding inflammatory changes. Spleen: Calcified granulomas are present. Adrenals/Urinary Tract: Bilateral renal cortical scarring is present. There are subcentimeter cortical hypodensities in both kidneys which are too small to characterize and unchanged. There is no hydronephrosis or perinephric fluid. The adrenal glands and bladder are within normal limits. Stomach/Bowel: There is diffuse colonic diverticulosis. There is wall thickening and inflammatory stranding of the mid sigmoid colon compatible with acute  diverticulitis. Micro perforation is present. No focal abscess identified. There is no bowel obstruction. Small hiatal hernia is again seen. In the appendix is not visualized. Vascular/Lymphatic: Aortic atherosclerosis. No enlarged abdominal or pelvic lymph nodes. Reproductive: Status post hysterectomy. No adnexal masses. Other: Trace free fluid in the pelvis. Fat containing paraumbilical hernia appears unchanged. Musculoskeletal: The bones are osteopenic. L5 compression deformity is chronic and unchanged. Right hip arthroplasty is present. IMPRESSION: 1. Acute sigmoid colon diverticulitis with micro perforation. No focal abscess. 2. Cholelithiasis. 3. Subcentimeter Bosniak II renal cyst, too small to characterize. No follow-up imaging is recommended. JACR 2018  Feb; 264-273, Management of the Incidental Renal Mass on CT, [employer] 2021; 814-848, Bosniak Classification of Cystic Renal Masses, Version 2019. Aortic Atherosclerosis (ICD10-I70.0). Electronically Signed   By: Ronney Asters M.D.   On: 10/09/2022 16:06     The results of significant diagnostics from this hospitalization (including imaging, microbiology, ancillary and laboratory) are listed below for reference.     Microbiology: No results found for this or any previous visit (from the past 240 hour(s)).   Labs: BNP (last 3 results) No results for input(s): "BNP" in the last 8760 hours. Basic Metabolic Panel: Recent Labs  Lab 10/09/22 1327 10/10/22 0422 10/10/22 1523 10/11/22 0448 10/12/22 0923  NA 139 139 137 139 139  K 2.6* 2.7* 3.7 3.0* 3.5  CL 103 104 103 101 103  CO2 19* '24 27 26 29  '$ GLUCOSE 203* 76 99 82 109*  BUN '13 11 8 '$ 7* 6*  CREATININE 0.67 0.61 0.64 0.55 0.42*  CALCIUM 8.8* 8.7* 8.7* 8.6* 8.8*  MG  --  2.0  --   --  2.2   Liver Function Tests: Recent Labs  Lab 10/09/22 1327  AST 19  ALT 11  ALKPHOS 71  BILITOT 0.8  PROT 7.1  ALBUMIN 3.9   Recent Labs  Lab 10/09/22 1327  LIPASE 31   No results for  input(s): "AMMONIA" in the last 168 hours. CBC: Recent Labs  Lab 10/09/22 1327 10/10/22 0422 10/11/22 0448  WBC 9.3 8.7 7.2  HGB 14.2 12.0 11.9*  HCT 45.0 38.2 38.0  MCV 80.9 84.0 82.8  PLT 407* 328 265   Cardiac Enzymes: No results for input(s): "CKTOTAL", "CKMB", "CKMBINDEX", "TROPONINI" in the last 168 hours. BNP: Invalid input(s): "POCBNP" CBG: No results for input(s): "GLUCAP" in the last 168 hours. D-Dimer No results for input(s): "DDIMER" in the last 72 hours. Hgb A1c No results for input(s): "HGBA1C" in the last 72 hours. Lipid Profile No results for input(s): "CHOL", "HDL", "LDLCALC", "TRIG", "CHOLHDL", "LDLDIRECT" in the last 72 hours. Thyroid function studies No results for input(s): "TSH", "T4TOTAL", "T3FREE", "THYROIDAB" in the last 72 hours.  Invalid input(s): "FREET3" Anemia work up No results for input(s): "VITAMINB12", "FOLATE", "FERRITIN", "TIBC", "IRON", "RETICCTPCT" in the last 72 hours. Urinalysis No results found for: "COLORURINE", "APPEARANCEUR", "LABSPEC", "PHURINE", "GLUCOSEU", "HGBUR", "BILIRUBINUR", "KETONESUR", "PROTEINUR", "UROBILINOGEN", "NITRITE", "LEUKOCYTESUR" Sepsis Labs Recent Labs  Lab 10/09/22 1327 10/10/22 0422 10/11/22 0448  WBC 9.3 8.7 7.2   Microbiology No results found for this or any previous visit (from the past 240 hour(s)).   Time coordinating discharge:  I have spent 35 minutes face to face with the patient and on the ward discussing the patients care, assessment, plan and disposition with other care givers. >50% of the time was devoted counseling the patient about the risks and benefits of treatment/Discharge disposition and coordinating care.   SIGNED:   Damita Lack, MD  [facility] 10/12/2022, 12:31 PM   If 7PM-7AM, please contact night-coverage

## 2023-05-08 ENCOUNTER — Ambulatory Visit (INDEPENDENT_AMBULATORY_CARE_PROVIDER_SITE_OTHER): Payer: Medicare Other | Admitting: Psychology

## 2023-05-08 DIAGNOSIS — F331 Major depressive disorder, recurrent, moderate: Secondary | ICD-10-CM

## 2023-05-08 NOTE — Progress Notes (Unsigned)
[facility] Counselor Initial Adult Exam  Name: Darlene Lloyd Date: 05/08/2023 MRN: [id number] DOB: Apr 22, 1933 PCP: Thana Ates, MD  Time spent: 2:30pm-3:21pm  Pt is seen for a virtual visit via caregility.  Pt joins from her home, reporting privacy, and counselor from her home office.  Pt is aware of imitations of virtual visits and consents the visit.  Pt husband is present for some of the appointment to assist w/ history.    Guardian/Payee:  self    Paperwork requested: No   Reason for Visit /Presenting Problem:  Pt is seeking counseling for depression and stress.  Pt reports "I don't hava a lot of desire to do things or go places".  Pt reports she has struggled off and on for several years with depression.  Pt reports that most recent depressed mood triggered by the death of her best friend from Junior high school who died 3 months.  Her friend lived in [place] but they talked on the phone a lot.  Pt reports she has very few friends left and none left from the group she had from high school.  Pt reports only current friend lives in [place] and she and husband meet up w/ them for lunch occasionally.    I don't have any that I can call or talk to.  Pt also reports that for the past 1.5 year she has been having to use a wheel chair due to weakness in her legs and knees giving out.  Pt reports she tried physical therapy but that didn't help.  Her husband reports she quit physical therapy- pt doesn't recall this.  Husband felt that maybe was helping.  Pt was in hospital for Gastrointestinal problems 1.5 years ago and then in rehab center for a month following.  Pt reports she doesn't have any memory about this.  Pt reports she doesn't do a lot for self anymore other than dress and wash.       Mental Status Exam: Appearance:   Well Groomed     Behavior:  Appropriate  Motor:  Normal  Speech/Language:   Normal Rate  Affect:  Appropriate  Mood:  depressed  Thought process:  normal   Thought content:    WNL  Sensory/Perceptual disturbances:    WNL  Orientation:  oriented to person, place, time/date, and situation  Attention:  Good  Concentration:  Good  Memory:  Immediate;   Good Recent;   Fair Remote;   Good and [employer] of knowledge:   Good  Insight:    Good  Judgment:   Good  Impulse Control:  Good   Reported Symptoms:  pt reports depressed mood.  Pt reports loss of interest and increased sleeping 12-14 hours a day.  Pt reports feeling tired and wanting to sleep or nap.  Pt reports low energy and little engagement and no desire.  Pt reports not anxious.  Pt reports does worry about dying.  Pt reports feels lonely as no friends to talk to.  Pt report she no longer feels like herself.  Pt reports she is not concerned w/ her memory and husband reports that testing has been completed and no dx from.  Pt states "she feels bored and is boring".  Pt is thankful that her sons live nearby and that enjoys Sunday dinners together.  Pt reports no anxiety- just down and depressed.    Risk Assessment: Danger to Self:  No Self-injurious Behavior: No Danger to Others: No Duty to Warn:no  Physical Aggression / Violence:No  Access to Firearms a concern: No  Gang Involvement:No  Patient / guardian was educated about steps to take if suicide or homicide risk level increases between visits: no While future psychiatric events cannot be accurately predicted, the patient does not currently require acute inpatient psychiatric care and does not currently meet [facility] involuntary commitment criteria.  Substance Abuse History: Current substance abuse: No     Past Psychiatric History:   Previous psychological history is significant for anxiety and depression Outpatient Providers: psychiatrist.  20-30 years ago.   History of Psych Hospitalization: No  Psychological Testing:  none    Abuse History:  Victim of: No.,  none    Report needed: No. Victim of  Neglect:No. Perpetrator of  none   Witness / Exposure to Domestic Violence: No   Protective Services Involvement: No  Witness to [employer] Violence:  No   Family History:  Family History  Problem Relation Age of Onset   Hypertension Father     Living situation: the patient lives with her spouse.   They are from [place].  Moved here 3 years ago to be closer to both sons in the area.    Sexual Orientation: Straight  Relationship Status: married for 57 years.   Name of spouse / other:Darlene Lloyd If a parent, number of children / ages:2 sons- age 64 and 2.  2 adult grandchildren who live on their own and 2 grandchildren still in high school that live in the area.  Pt reports they have family dinners every Sunday.   One son works as a Human resources officer at [employer] and the other works for [employer].      Support Systems: her husband is her support.  Pt reports they have friends that live in [place] that they meet for lunch.    Financial Stress:  No   Income/Employment/Disability: Neurosurgeon: No   Educational History: Education:  not reported   Religion/Sprituality/World View: Not reported   Any cultural differences that may affect / interfere with treatment:  not applicable   Recreation/Hobbies: pt reports she used to read a lot.  Enjoys family Sunday dinner.  Stressors: Loss of friends    Strengths: husband  Barriers:  hearing loss   Legal History: Pending legal issue / charges: The patient has no significant history of legal issues. History of legal issue / charges:  none  Medical History/Surgical History: reviewed Past Medical History:  Diagnosis Date   ANA positive    Aortic valve stenosis    Asthma    BPPV (benign paroxysmal positional vertigo)    Cancer (HCC)    breast   Cervicalgia    COPD (chronic obstructive pulmonary disease) (HCC)    Depression    Diverticulosis    Fibromyalgia    GERD (gastroesophageal reflux disease)     Hearing loss    Hernia, diaphragmatic    Hyperopia of both eyes with astigmatism and presbyopia    Hypertension    Hypothyroidism    IBS (irritable bowel syndrome)    Melanoma in situ of left upper arm (HCC)    Neuropathy    Nuclear sclerosis of right eye    Obesity    Osteoporosis    Senile osteoporosis    Spinal stenosis of lumbar region with neurogenic claudication    Thoracic and lumbosacral neuritis    Vitamin D deficiency     Past Surgical History:  Procedure Laterality Date   APPENDECTOMY  1972   BLADDER SUSPENSION  1973   BREAST LUMPECTOMY  1997   CATARACT EXTRACTION  2012   ESOPHAGOGASTRODUODENOSCOPY N/A 05/04/2022   Procedure: ESOPHAGOGASTRODUODENOSCOPY (EGD);  Surgeon: Kerin Salen, MD;  Location: [facility];  Service: Gastroenterology;  Laterality: N/A;   ESOPHAGUS SURGERY  1989   hiatal  1989   laminectomy  1983   TOTAL HIP ARTHROPLASTY  2019   TOTAL KNEE ARTHROPLASTY  2001   TOTAL KNEE ARTHROPLASTY  2003    Medications: Current Outpatient Medications  Medication Sig Dispense Refill   albuterol (VENTOLIN HFA) 108 (90 Base) MCG/ACT inhaler Inhale 2 puffs into the lungs every 4 (four) hours as needed for wheezing or shortness of breath.     ALPRAZolam (XANAX) 0.25 MG tablet Take 0.25 mg by mouth at bedtime as needed for anxiety.     AMLODIPINE BESYLATE PO Take 10 mg by mouth daily.     ascorbic acid (VITAMIN C) 500 MG tablet Take 500 mg by mouth daily.     celecoxib (CELEBREX) 200 MG capsule Take 200 mg by mouth 2 (two) times daily.     Cholecalciferol (VITAMIN D3) 50 MCG (2000 UT) capsule Take 2,000 Units by mouth daily.     dicyclomine (BENTYL) 20 MG tablet Take 20 mg by mouth 3 (three) times daily as needed for spasms.     escitalopram (LEXAPRO) 20 MG tablet Take 30 mg by mouth daily.     hyoscyamine (LEVBID) 0.375 MG 12 hr tablet Take 0.375 mg by mouth every 12 (twelve) hours as needed for cramping.     levothyroxine (SYNTHROID) 150 MCG tablet Take 150  mcg by mouth daily before breakfast.     losartan (COZAAR) 50 MG tablet Take 50 mg by mouth daily.     MAGNESIUM PO Take 250 mg by mouth daily.     MULTIPLE VITAMIN PO Take 1 tablet by mouth daily.     pantoprazole (PROTONIX) 40 MG tablet Take 1 tablet (40 mg total) by mouth 2 (two) times daily before a meal. 60 tablet 0   Potassium 99 MG TABS Take 99 mg by mouth daily.     pyridOXINE (VITAMIN B-6) 100 MG tablet Take 100 mg by mouth daily.     No current facility-administered medications for this visit.    Allergies  Allergen Reactions   Codeine Other (See Comments)    UNK   Sulfasalazine Other (See Comments)    UNK reaction    Diagnoses:  Major depressive disorder, recurrent episode, moderate (HCC)  Plan of Care: Pt is an 89y/o married female seeking counseling for depression.  Pt reports she is struggling w/ loss of interest in doing things and just wants to sleep and stay at home.  Pt acknowledges difficulty w/ mobility/being in wheelchair impacts this.  Pt also reports feeling down and missing friends she that have died over the years.  Pt reports support of husband.  Pt is on medication through her PCP.  Pt to f/u w/ counseling in 2 weeks in person and will develop tx plan w/ counselor at that visit.     Forde Radon, [facility]

## 2023-05-25 ENCOUNTER — Ambulatory Visit: Payer: Medicare Other | Admitting: Psychology

## 2023-05-25 DIAGNOSIS — F331 Major depressive disorder, recurrent, moderate: Secondary | ICD-10-CM | POA: Diagnosis not present

## 2023-05-25 NOTE — Progress Notes (Signed)
[facility] Counselor/Therapist Progress Note  Patient ID: Darlene Lloyd, MRN: [id number],    Date: 05/25/2023  Time Spent: 1:28pm-2:16pm  Pt is seen for in person session.   Treatment Type: Individual Therapy  Reported Symptoms: depressed mood, low energy, loss of interest, little engagement w/ others.  Mental Status Exam: Appearance:  Fairly Groomed     Behavior: Appropriate  Motor: Normal   Speech/Language:  Clear and Coherent and Normal Rate  Affect: Depressed  Mood: depressed  Thought process: normal  Thought content:   WNL  Sensory/Perceptual disturbances:   WNL  Orientation: oriented to person, place, time/date, and situation  Attention: Good  Concentration: Good  Memory: Immediate;   Good and Fair Recent;   Fair and Poor Remote;   Good  Fund of knowledge:  Good  Insight:   Good  Judgment:  Good  Impulse Control: Good   Risk Assessment: Danger to Self:  No Self-injurious Behavior: No Danger to Others: No Duty to Warn:no Physical Aggression / Violence:No  Access to Firearms a concern: No  Gang Involvement:No   Subjective: Counselor assessed pt current functioning per pt report.  Developed tx plan w/ pt.  Processed w/pt depressed mood and contributing factors.  Discussed benefit of increased engagement for mood and explored ways to implement.  Pt affect congruent w/ depressed mood.   Pt memory is fair- cannot recall more recent things, repeats self some in session.  Pt reports mood depressed.  Pt reports doesn't feel like doing things and no energy for.  Pt reports no longer reading like used to.  Pt reports little social engagement- avoids going out because of mobility issues and fear of falling.  Pt reports only 2 friend couples left and sees sons for Sunday dinners.  Pt identified that would be good to increase engagement w/ spending more time on front porch to engage w/ neighbors.    Interventions: Cognitive Behavioral Therapy and  Solution-Oriented/Positive Psychology  Diagnosis:Major depressive disorder, recurrent episode, moderate (HCC)  Plan: Pt to f/u w/ counseling in 2 weeks.  Pt to f/u w/ PCP re: potential medical issues that could be impacting energy level.    Individualized Treatment Plan Strengths: wanting to have someone to talk to  Supports: her husband is her support. Pt reports they have friends that live in [facility] that they meet for lunch.    Goal/Needs for Treatment:  In order of importance to patient 1) increasing engagement- socially and w/ activities- decrease depression 2) exploring fears of falling/dying 3) ---   Client Statement of Needs: "I would like to get out of this chair.  I don't have any ambition for things.  It would like to be able to enjoy things.  I want to talk about my fear of dying."    Treatment Level:outpt counseling  Symptoms:depressed mood, loss of interest  Client Treatment Preferences:counseling 1-2 times a month.   Healthcare consumer's goal for treatment:  Counselor, Forde Radon, [facility] will support the patient's ability to achieve the goals identified. Cognitive Behavioral Therapy, Assertive Communication/Conflict Resolution Training, Relaxation Training, ACT, Humanistic and other evidenced-based practices will be used to promote progress towards healthy functioning.   Healthcare consumer will: Actively participate in therapy, working towards healthy functioning.    *Justification for Continuation/Discontinuation of Goal: R=Revised, O=Ongoing, A=Achieved, D=Discontinued  Goal 1) increased pt engagement in activities and social interactions to assist in reducing depression. Baseline date 05/25/23: Progress towards goal 0; How Often - Daily Target Date Goal Was reviewed Status Code  Progress towards goal/Likert rating  05/24/24                Goal 2) Explore fears re: death and dying, identifying and verbalize and work towards more acceptance per pt report and  therapist observation. Baseline date 05/25/23: Progress towards goal 0; How Often - Daily Target Date Goal Was reviewed Status Code Progress towards goal  05/24/24                This plan has been reviewed and created by the following participants:  This plan will be reviewed at least every 12 months. Date Behavioral Health Clinician Date Guardian/Patient   05/25/23  Forde Radon, [facility] 05/25/23 Verbal Consent Provided                   Forde Radon, [facility]

## 2023-05-25 NOTE — Progress Notes (Signed)
I would like to get out of this chair.  Told I would never walk w/ a cane. Wasn't encouraging.   I don't have any ambition for things.  Get up 12pm 1pmt I'm afraid of dying.                 Darlene Lloyd, [facility]

## 2023-06-08 ENCOUNTER — Ambulatory Visit: Payer: Medicare Other | Admitting: Psychology
